# Patient Record
Sex: Female | Born: 1945 | Race: White | Hispanic: No | Marital: Single
Health system: Midwestern US, Community
[De-identification: ages and names within clinical notes are randomized; demographics above are authoritative.]

## PROBLEM LIST (undated history)

## (undated) DIAGNOSIS — J849 Interstitial pulmonary disease, unspecified: Principal | ICD-10-CM

## (undated) DIAGNOSIS — L03116 Cellulitis of left lower limb: Principal | ICD-10-CM

## (undated) DIAGNOSIS — M349 Systemic sclerosis, unspecified: Principal | ICD-10-CM

---

## 2014-09-11 IMAGING — CR L-SPINE 2-3 VWS
1 series · 3 of 3 positions shown · non-contrast
Comparison: None

HISTORY: Pain
TECHNIQUE: L-SPINE 2-3 VWS

[Series 1: AP · 0.17mm/px · 3 of 3 slices shown]
[im 1/3]
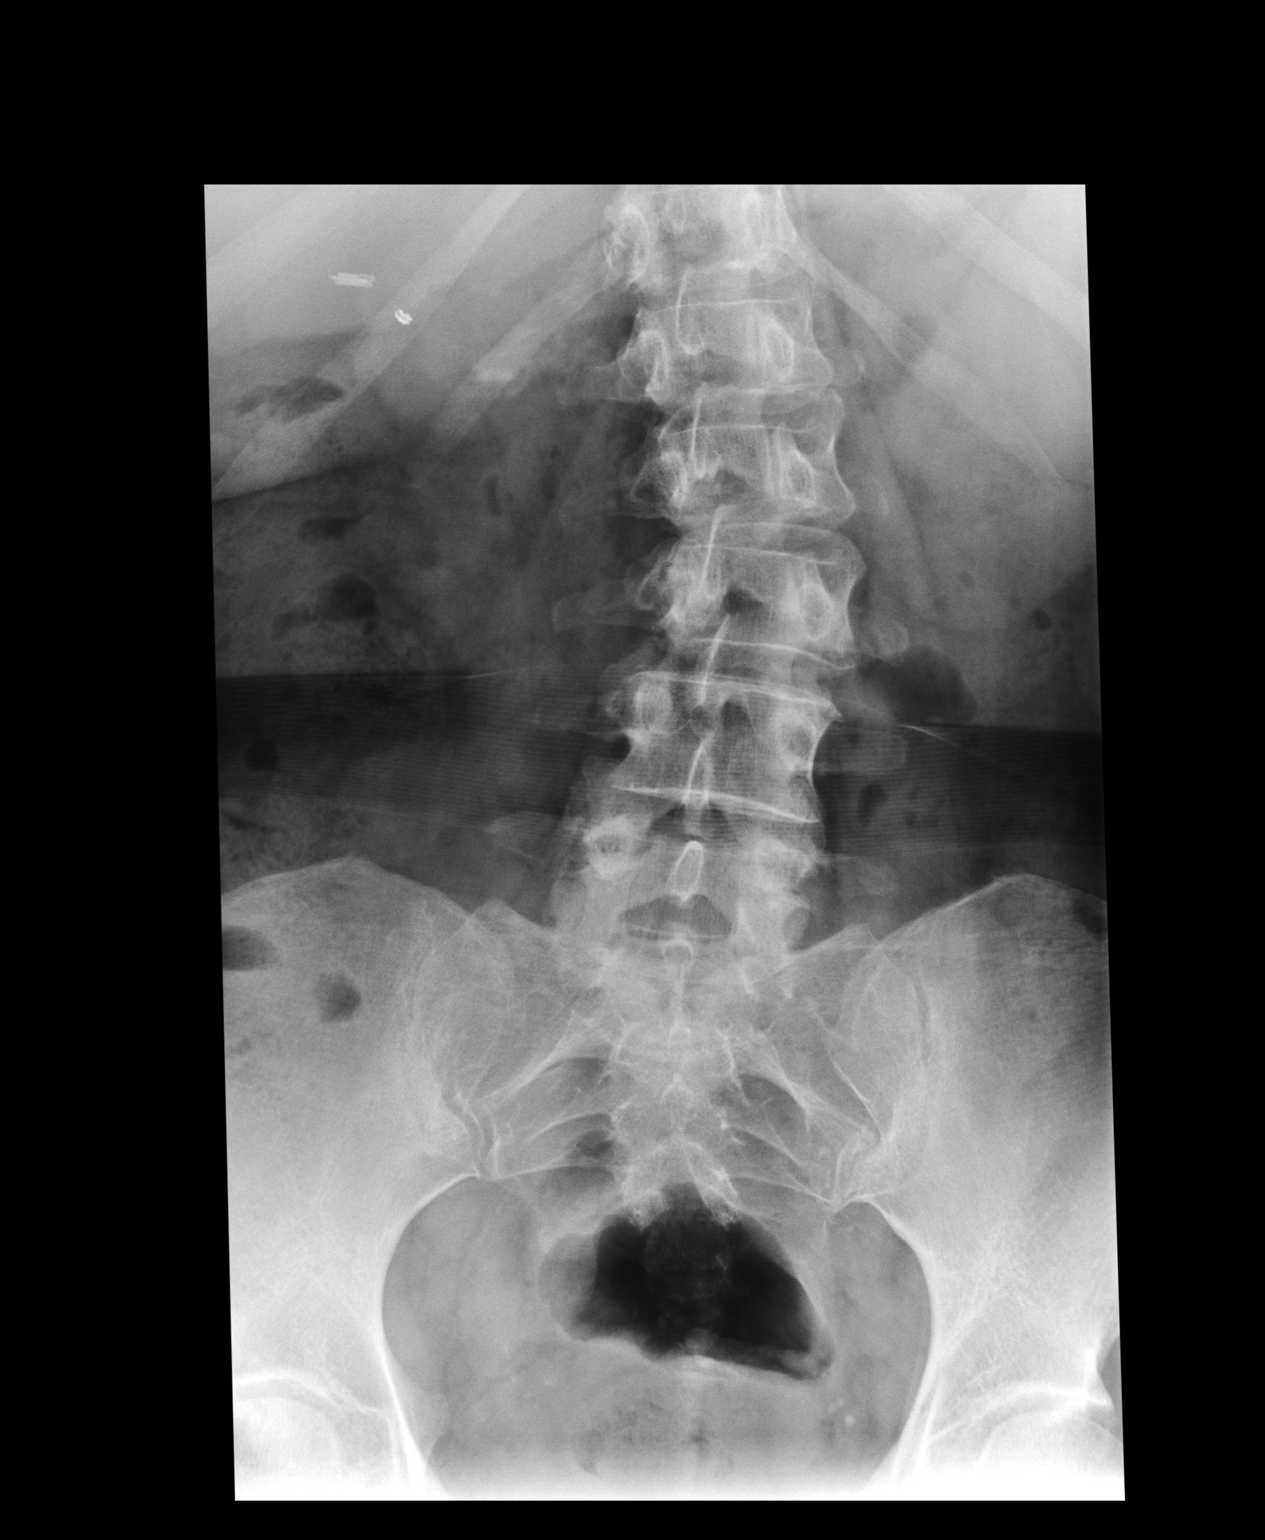
[im 2/3]
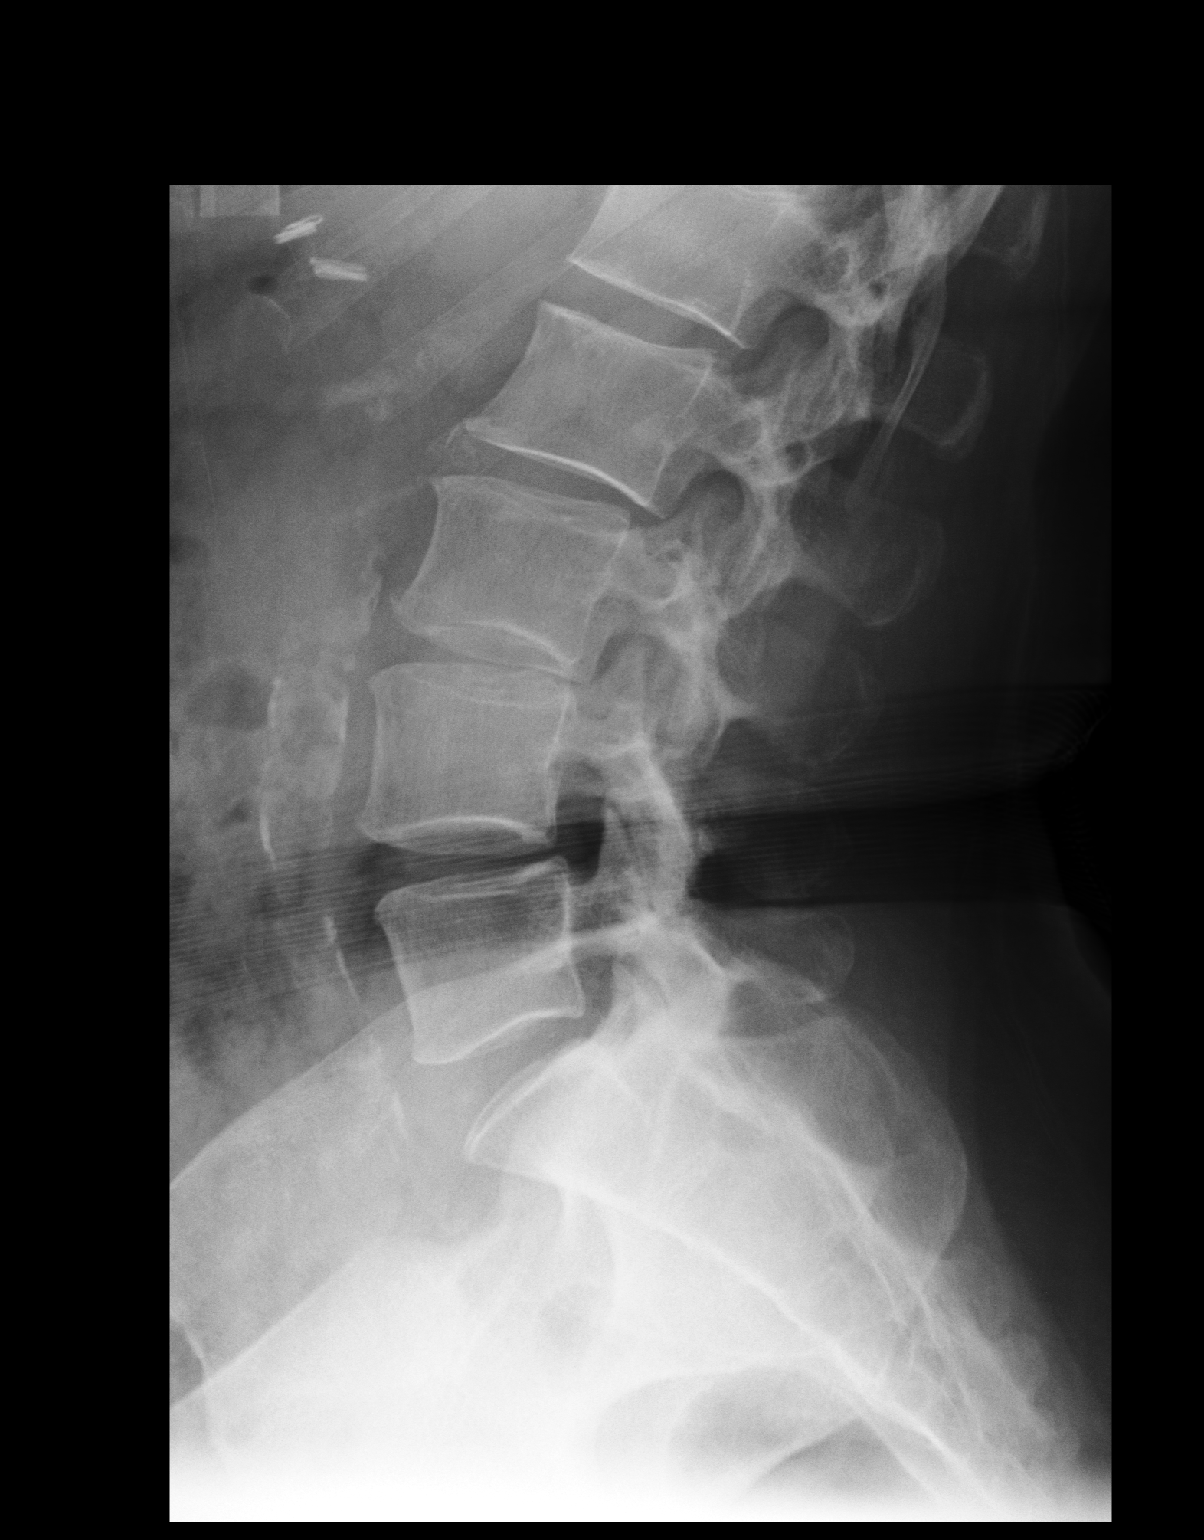
[im 3/3]
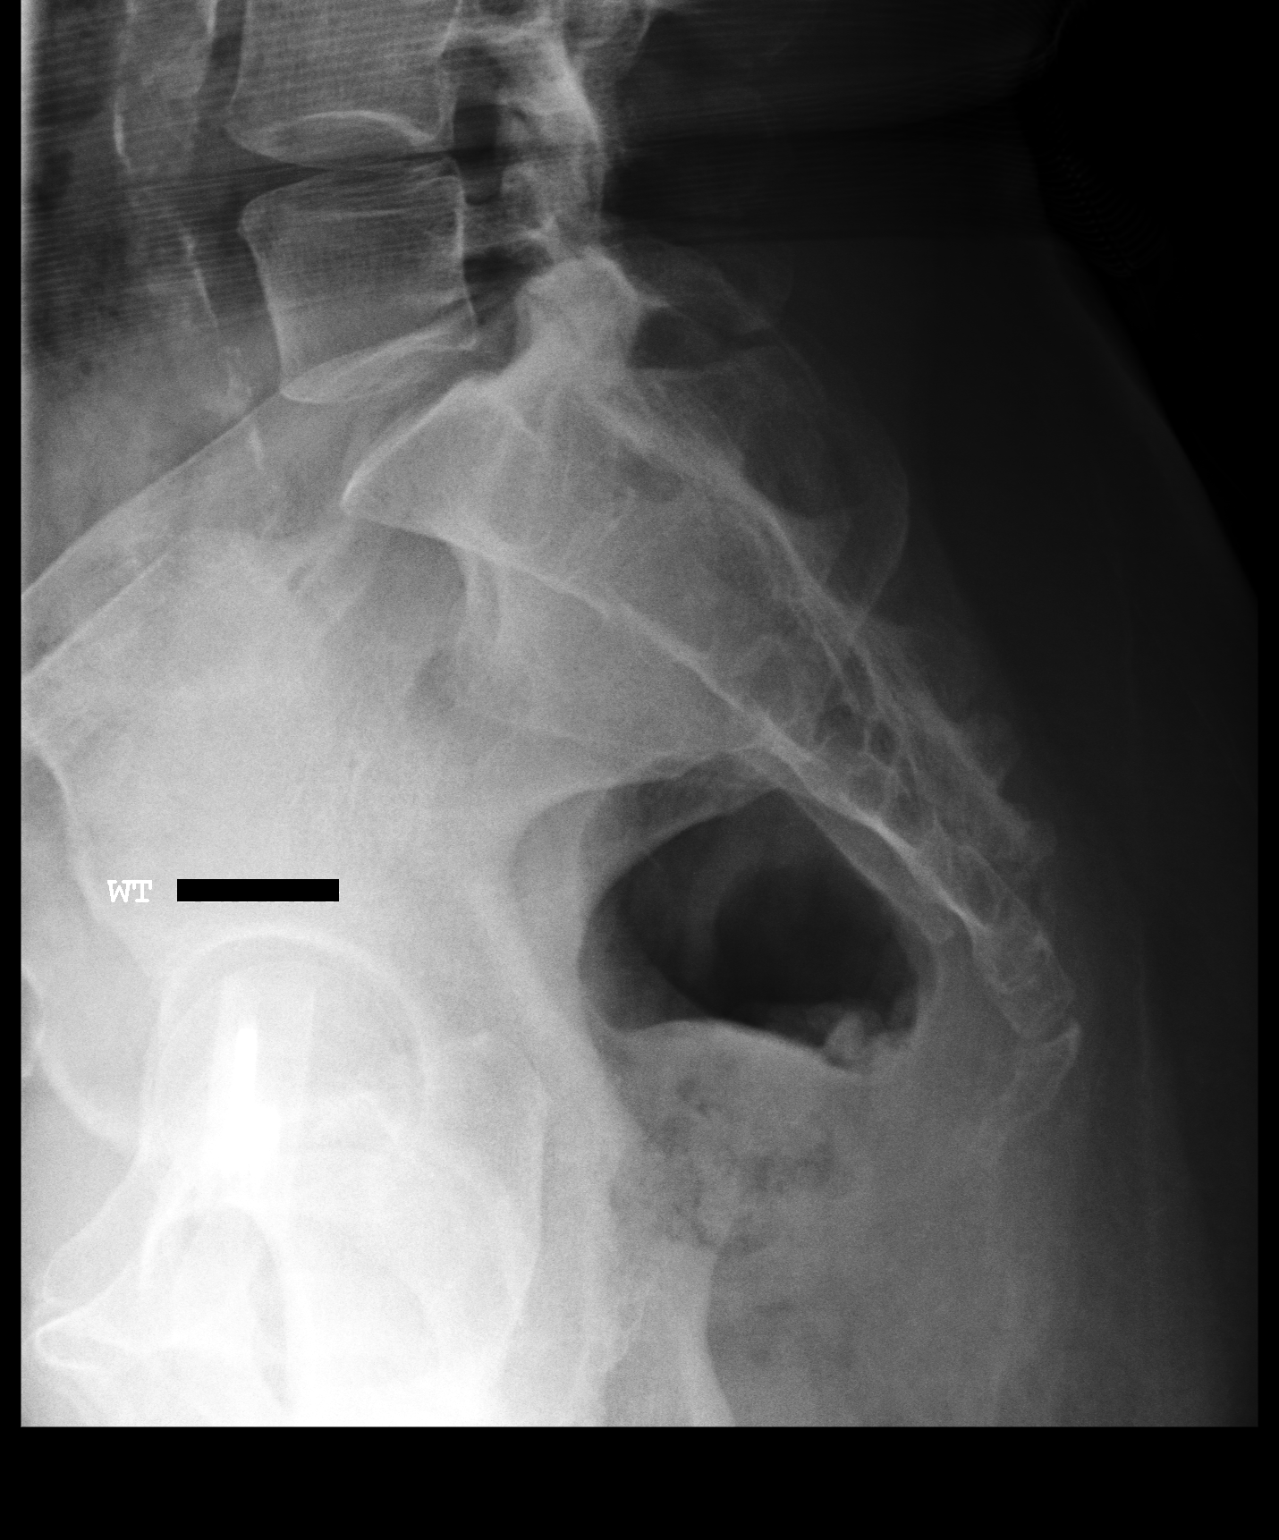

[3 of 3 positions shown; findings below may reference images not displayed]

FINDINGS: Three view lumbar spine demonstrates mild to moderate lumbar levoscoliosis apex at L3. Five lumbar segments. No fractures. No destructive lesions. No anomalous segments. Mild multilevel degenerative loss of disc space height. Mild grade 1 anterolisthesis L4-5. Multilevel facet arthropathy. Vascular calcifications of the aorta. Surgical clips right upper quadrant.
IMPRESSION: Mild to moderate lumbar levoscoliosis with associated multilevel degenerative disc disease and facet arthropathy. Grade 1 anterolisthesis L4-5. No fracture or destructive lesion. No anomalous segments

## 2015-10-26 IMAGING — CT CT ABDOMEN W CONTRAST
2 of 4 series · 16 of 46 positions shown, 18 images · IV contrast (isovue)
Comparison: None available.

HISTORY: Leukemia, left upper quadrant pain.
TECHNIQUE: Axial images through the abdomen and 5 mm. Coronal and sagittal images were reformatted. 

IV contrast: 80 cc Isovue 320. 
Oral contrast: 2777 cc Gastroview. 
I-STAT creatinine 0.8 mg/dL.

[Series 3: soft tissue · axial · 0.72mm/px · z∈[-298,-73]mm · 13 of 51 slices shown, 15 images]
[im 3/51  soft-tissue]
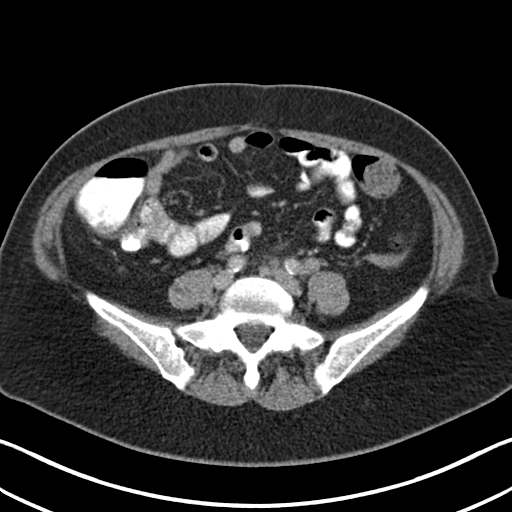
[im 3/51  bone]
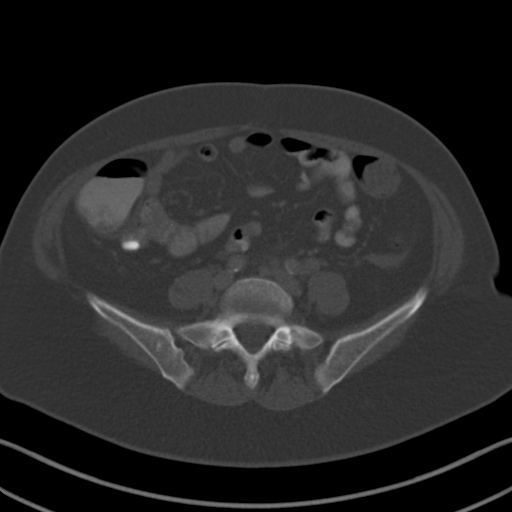
[im 7/51  soft-tissue]
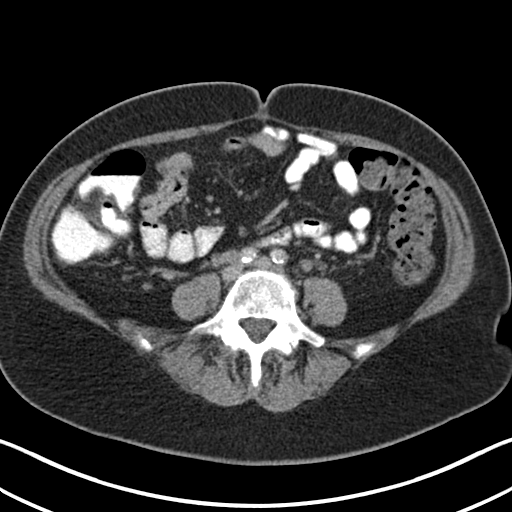
[im 12/51  soft-tissue]
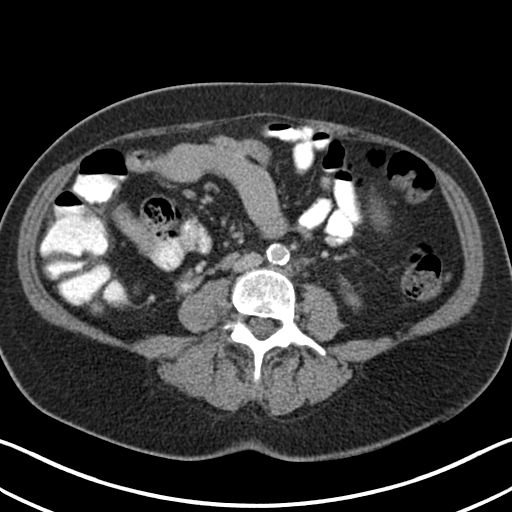
[im 14/51  soft-tissue]
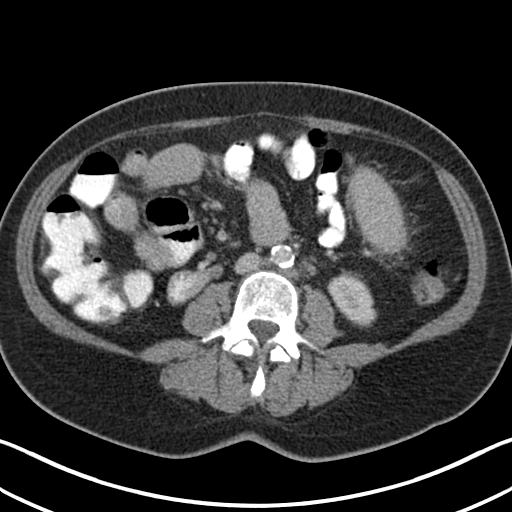
[im 19/51  soft-tissue]
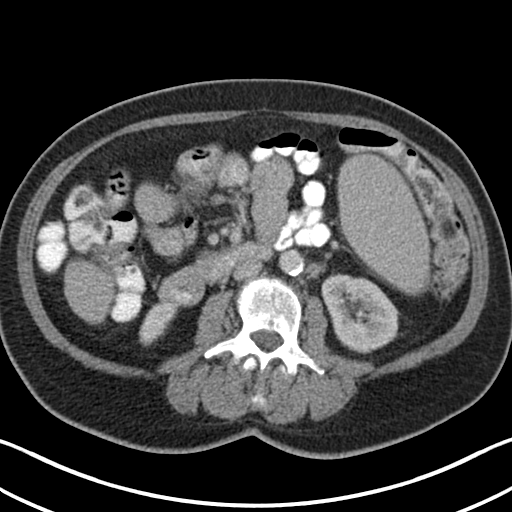
[im 21/51  soft-tissue]
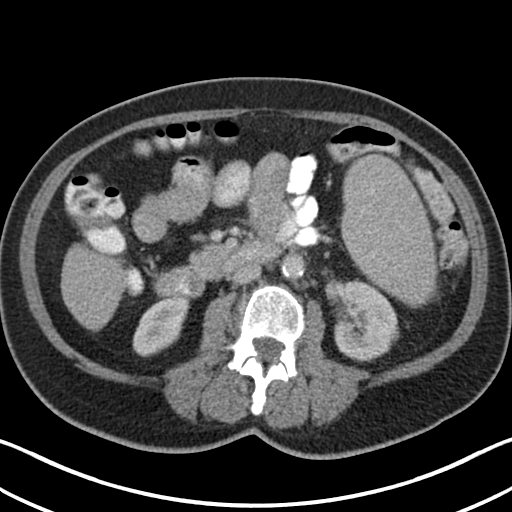
[im 26/51  soft-tissue]
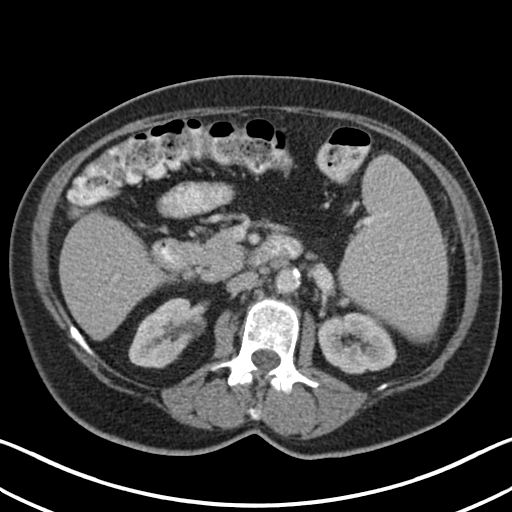
[im 30/51  soft-tissue]
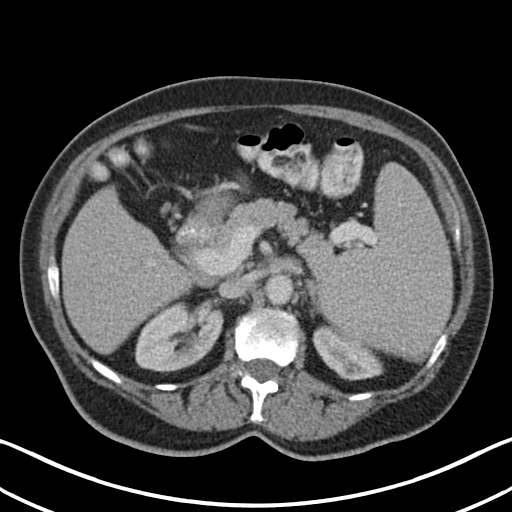
[im 32/51  soft-tissue]
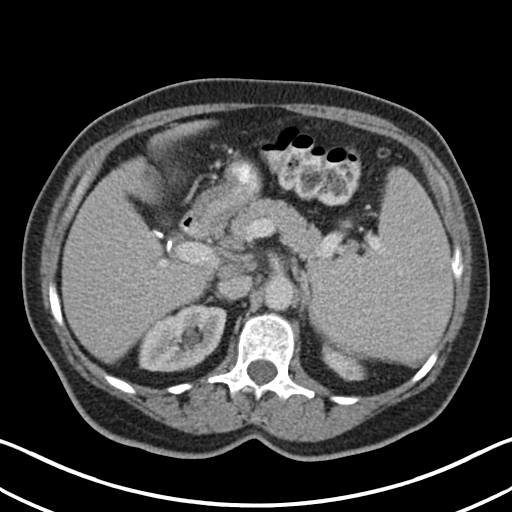
[im 32/51  bone]
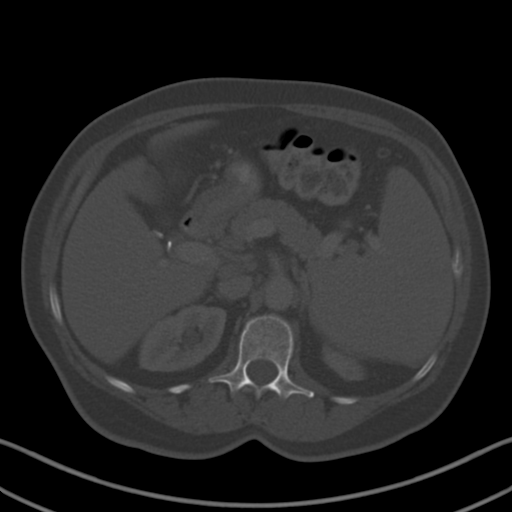
[im 37/51  soft-tissue]
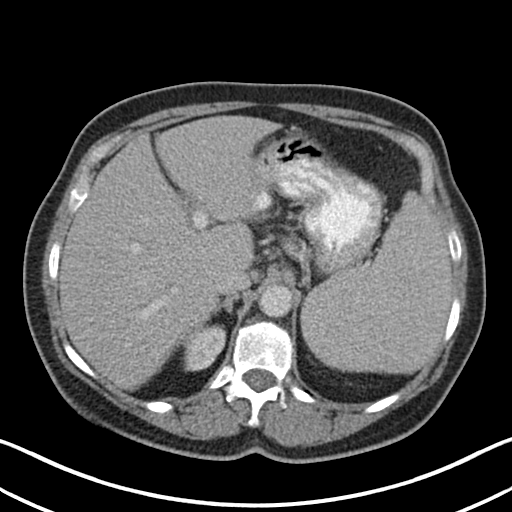
[im 39/51  soft-tissue]
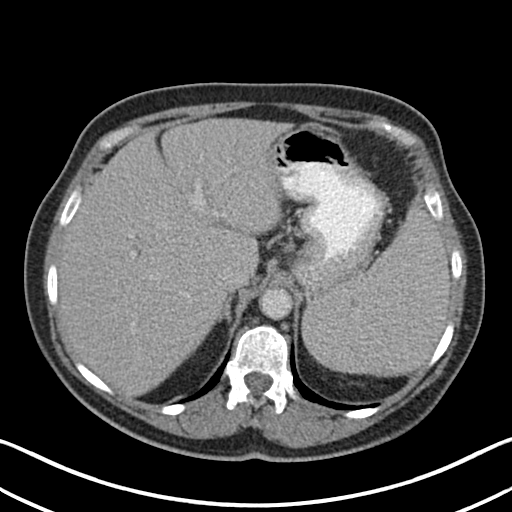
[im 44/51  soft-tissue]
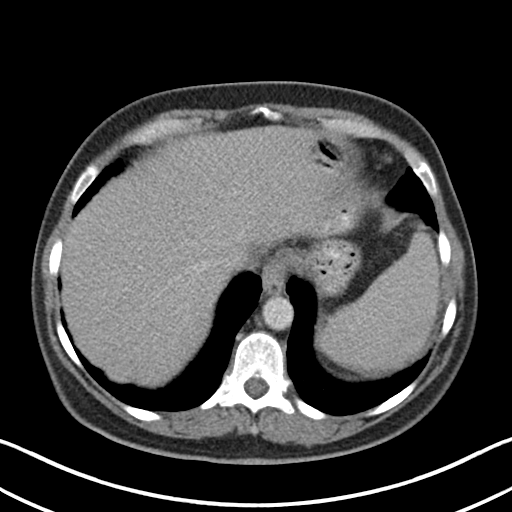
[im 48/51  soft-tissue]
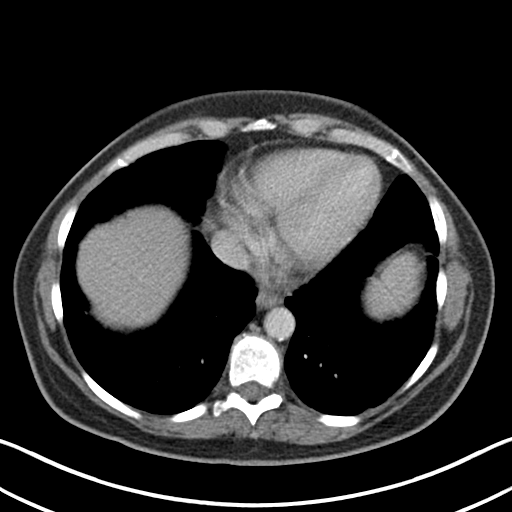

[Series 7: coronal · coronal · 0.53mm/px · 3 of 53 slices shown]
[im 18/53  soft-tissue]
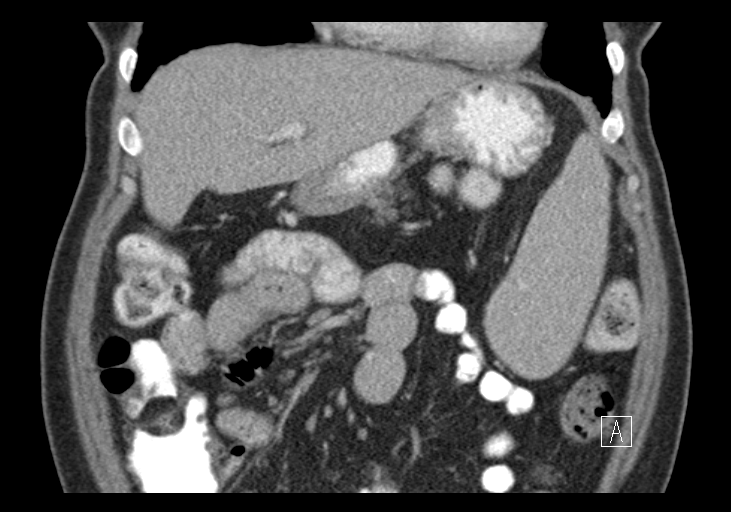
[im 24/53  soft-tissue]
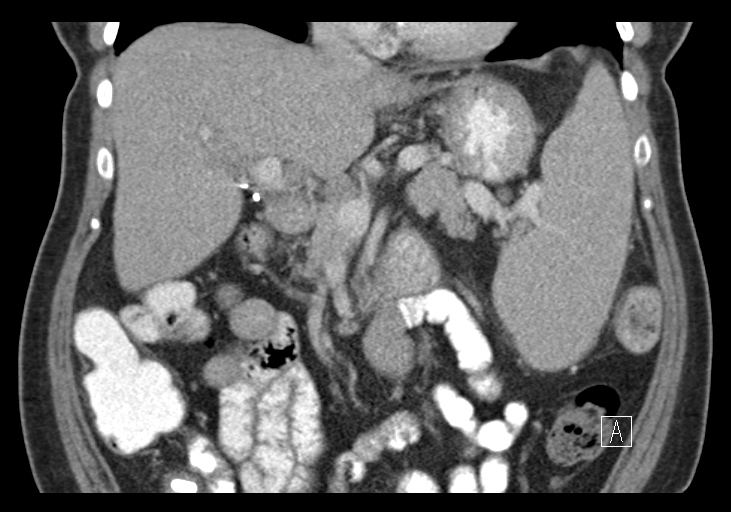
[im 29/53  soft-tissue]
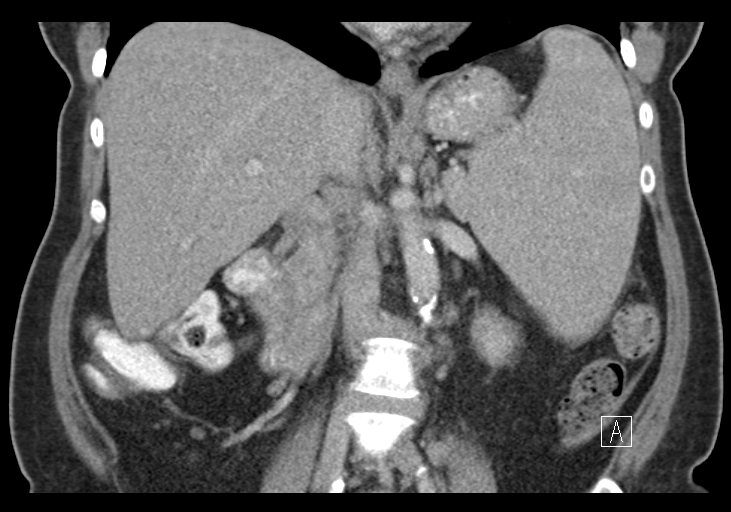

[16 of 46 positions shown; findings below may reference images not displayed]

FINDINGS: The lung bases are clear.

The spleen is enlarged measuring 14 x 7.8 cm axial dimension and 17 cm craniocaudad dimension. There are mildly enlarged lymph nodes near the porta hepatis/celiac artery. Largest measures 1.6 cm greatest dimension.

The gallbladder has been removed. The liver is unremarkable in appearance. The pancreas, adrenals and kidneys are unremarkable apart from a small cyst at the upper pole of the left kidney.

The abdominal aorta is normal in caliber. There are mildly enlarged retroperitoneal lymph nodes. The largest measures 2 cm, adjacent to the left common iliac artery.

There is no free intraperitoneal air or free fluid. No abnormally dilated or thickened loops of bowel are identified.

Bone windows show no destructive lesion.
IMPRESSION: Splenomegaly with mildly enlarged upper abdominal and retroperitoneal lymph nodes. If there are prior outside studies available for comparison, stability of these findings can be assessed. Alternatively, metabolic assessment with PET/CT may be of benefit.

## 2016-03-13 IMAGING — CT CT CHEST ABD PELVIS W CON
2 of 4 series · 14 of 46 positions shown, 16 images · non-contrast
Comparison: CT abdomen study October 26, 2015.

HISTORY: Chronic lymphocytic leukemia, B-cell type.
TECHNIQUE: Axial images of the chest, abdomen and pelvis were obtained from the lung apices through the pubic symphysis following 100 cc Optiray 320 intravenous contrast. Serum creatinine 0.7 mg/dL. Coronal and sagittal reformation images were obtained.

[Series 3: soft tissue · axial · 0.70mm/px · z∈[-655,-85]mm · 11 of 126 slices shown, 13 images]
[im 6/126  soft-tissue]
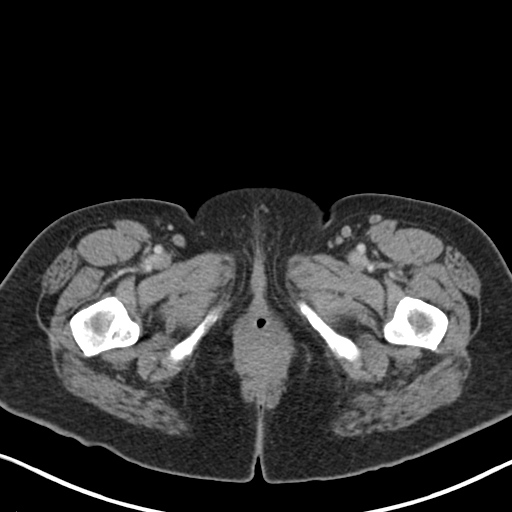
[im 6/126  bone]
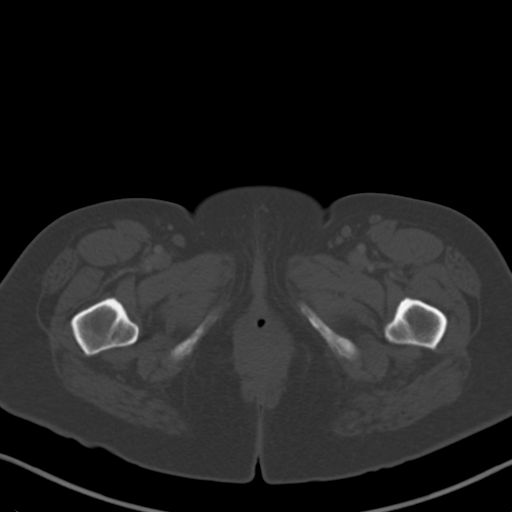
[im 18/126  soft-tissue]
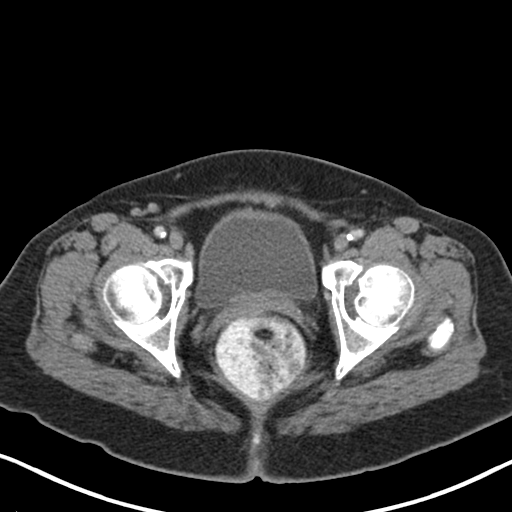
[im 29/126  soft-tissue]
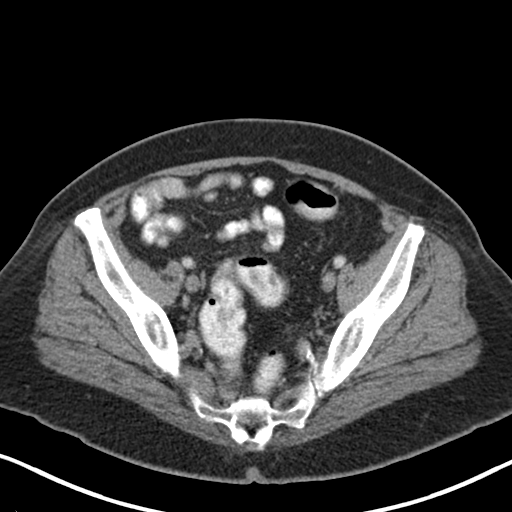
[im 40/126  soft-tissue]
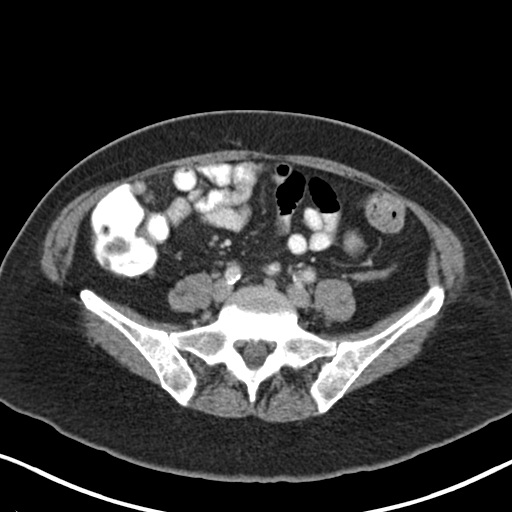
[im 52/126  soft-tissue]
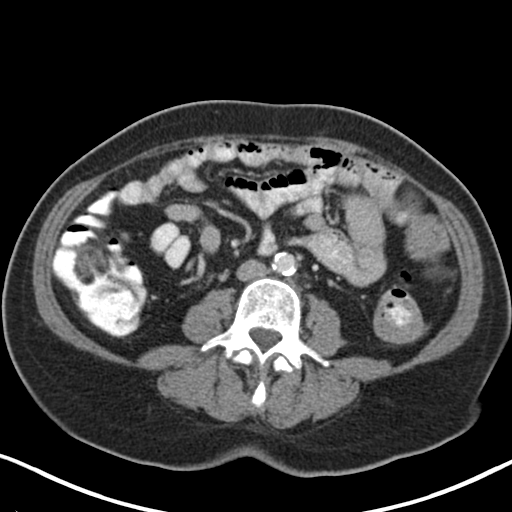
[im 63/126  soft-tissue]
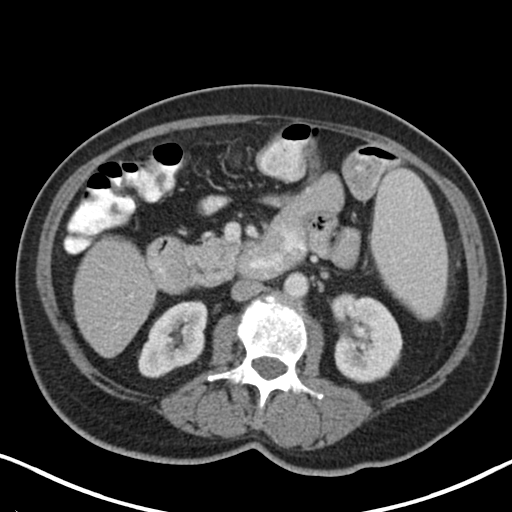
[im 74/126  soft-tissue]
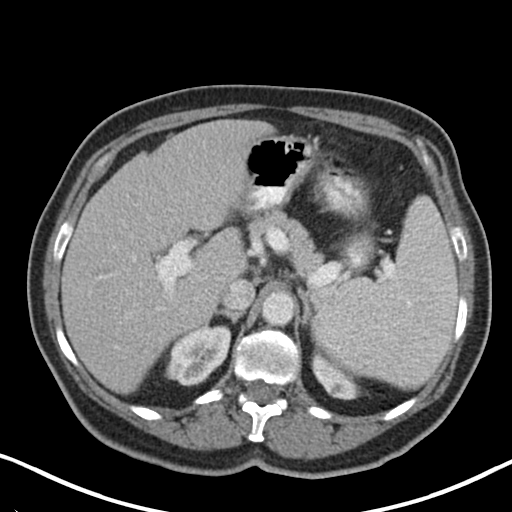
[im 86/126  soft-tissue]
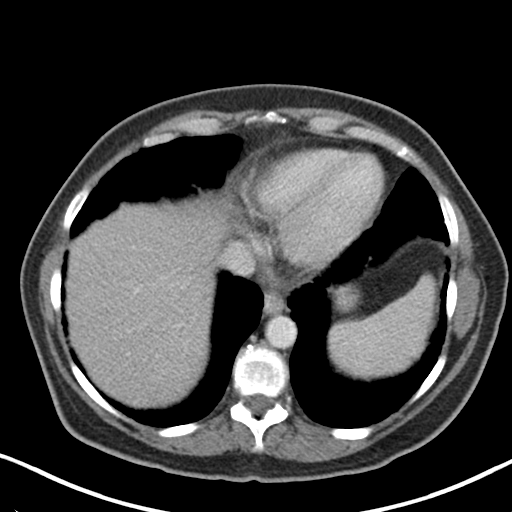
[im 97/126  soft-tissue]
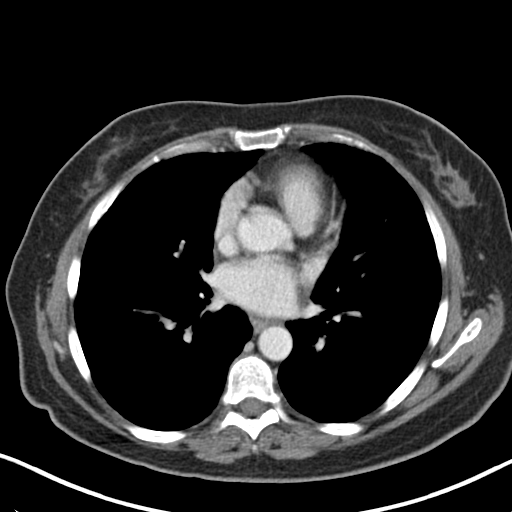
[im 97/126  bone]
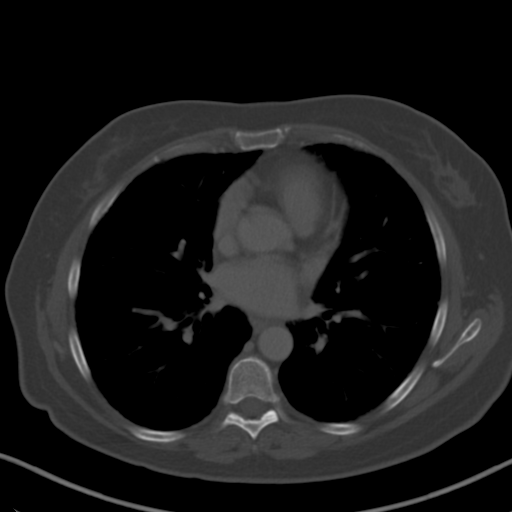
[im 108/126  soft-tissue]
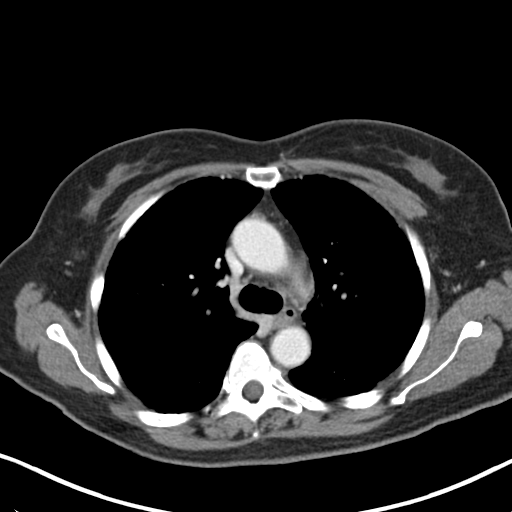
[im 120/126  soft-tissue]
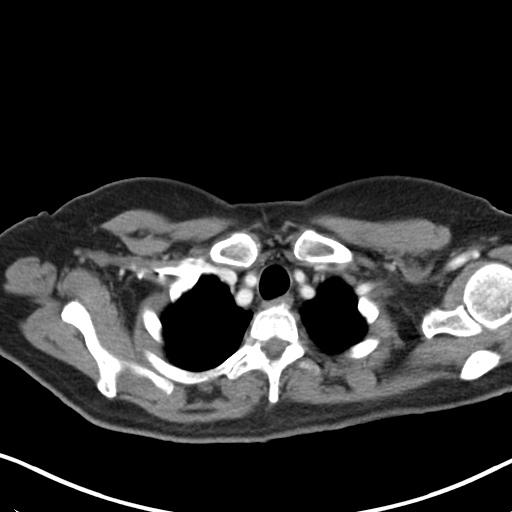

[Series 5: coronal · coronal · 0.74mm/px · 3 of 52 slices shown]
[im 18/52  soft-tissue]
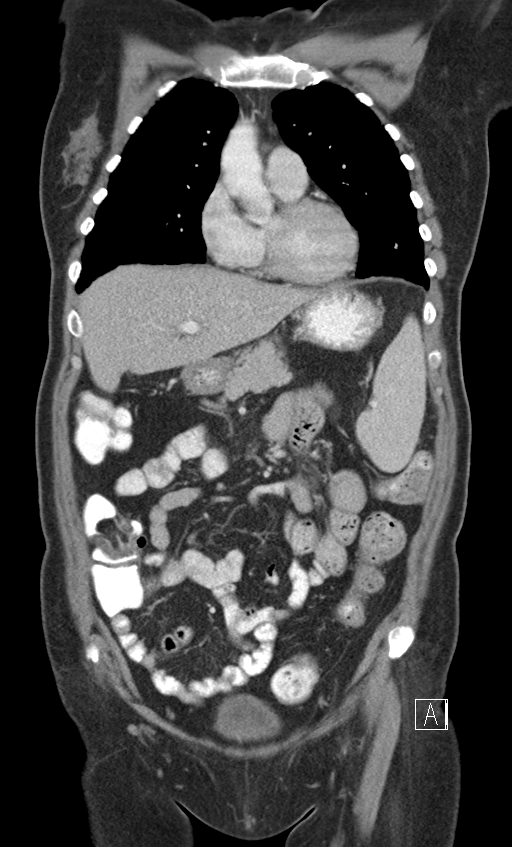
[im 23/52  soft-tissue]
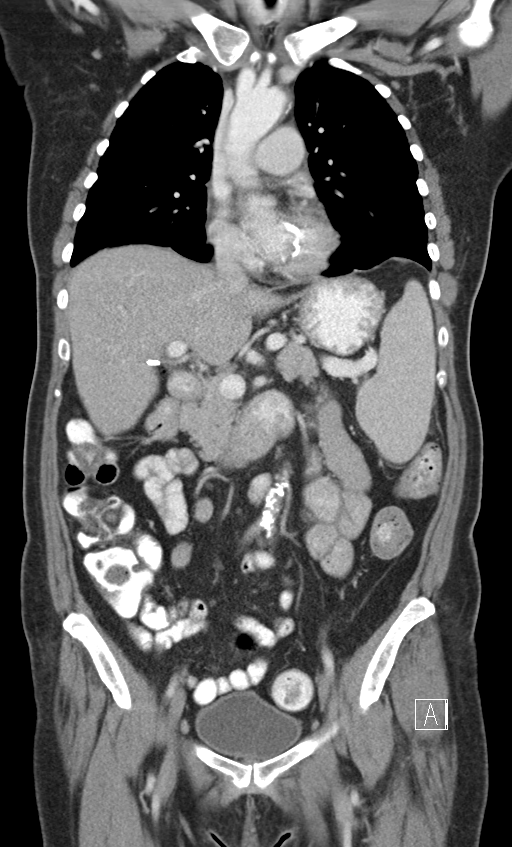
[im 29/52  soft-tissue]
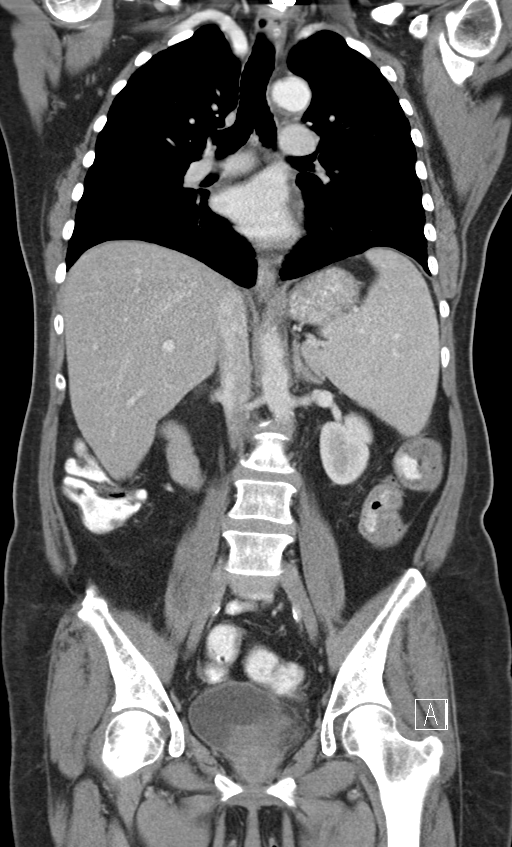

[14 of 46 positions shown; findings below may reference images not displayed]

FINDINGS: CT CHEST:

Peripheral intralobular and interlobular interstitial thickening identified bilaterally. No area of honeycombing seen. No pulmonary nodules seen. Trachea and main bronchi are normal.

Heart size is normal. There is no pericardial effusion. Calcification involving mitral annulus identified. Calcified and noncalcified plaques of aorta seen without aneurysm. Main pulmonary arteries are normal. Thyroid gland and esophagus are normal. No enlarged mediastinal or axillary lymph nodes seen. Breast tissues are normal. Bones show no acute pathology. Curvature of middle thoracic spine to right seen.

CT ABDOMEN:

Calcified plaques of aorta seen without aneurysm. Inferior vena cava is normal.

There is prior cholecystectomy. Liver, pancreas, adrenal glands and right kidney are normal. Small simple cyst superior pole of left kidney seen. Splenomegaly identified approximately 13.6 cm in maximum diameter in the axial plane. No splenic mass or cyst seen.

Distal esophagus, stomach and bowel loops are normal. Appendix is surgically absent. 5 mm umbilical fatty hernia seen. No enlarged lymph nodes seen. Degenerative changes of spine seen. Curvature lumbar spine to left with apex at L3 seen.

CT PELVIS:

Vaginal cuff is normal. Urinary bladder is normal. Ovaries are surgically absent. Distal bowel loops are normal. No enlarged lymph nodes seen. Degenerative changes of SI joints and pubic symphysis identified.

Coronal and sagittal reformation images confirm above findings.
IMPRESSION: Splenomegaly identified with interval slight improvement.

Interval resolution of lymphadenopathy of upper abdomen seen on previous study.

No new foci of lymphadenopathy seen.

There is overall improvement of leukemia.

Chronic interstitial disease identified bilaterally.

Additional chronic findings of chest, abdomen and pelvis as noted.

## 2016-05-02 IMAGING — MG MAMMO SCRN BIL W/CAD TOMO
8 series · 8 of 24 positions shown · non-contrast
Comparison: none

Images Obtained from [facility]
CLINICAL RA REF: Mammo Scrn bilateral (Digital) W/Cad Routine with Tomosynthesis.
Digital images were generated from the 3D Tomosynthesis data acquired during the exam.
No prior exams were available for comparison.
There are scattered fibroglandular elements in both breasts.
Current study was also evaluated with a Computer Aided Detection (CAD) system.
No significant masses, calcifications, or other findings are seen in either breast.

[L CC]
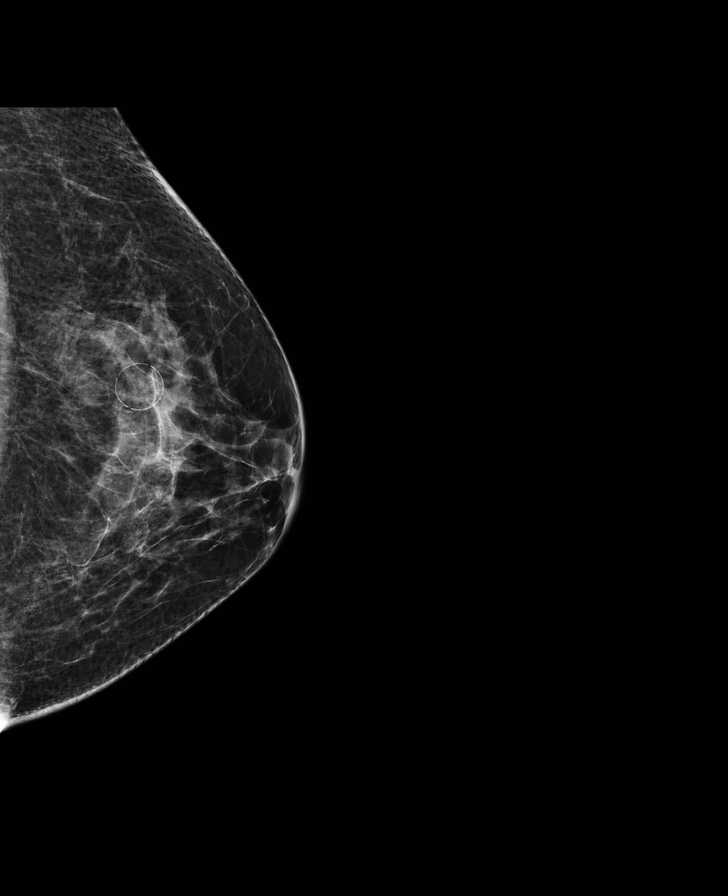

[R CC]
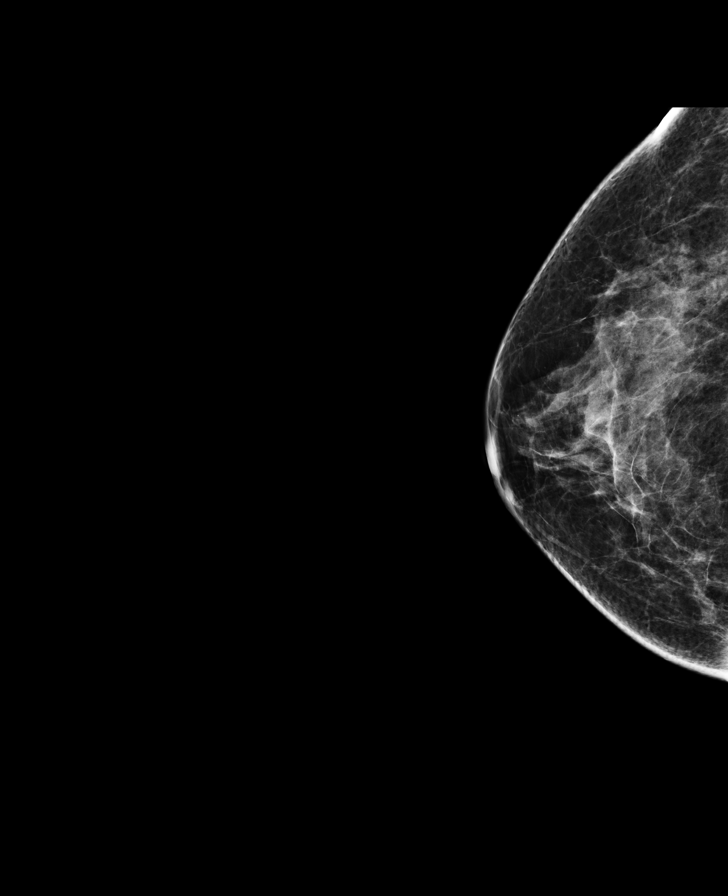

[L MLO]
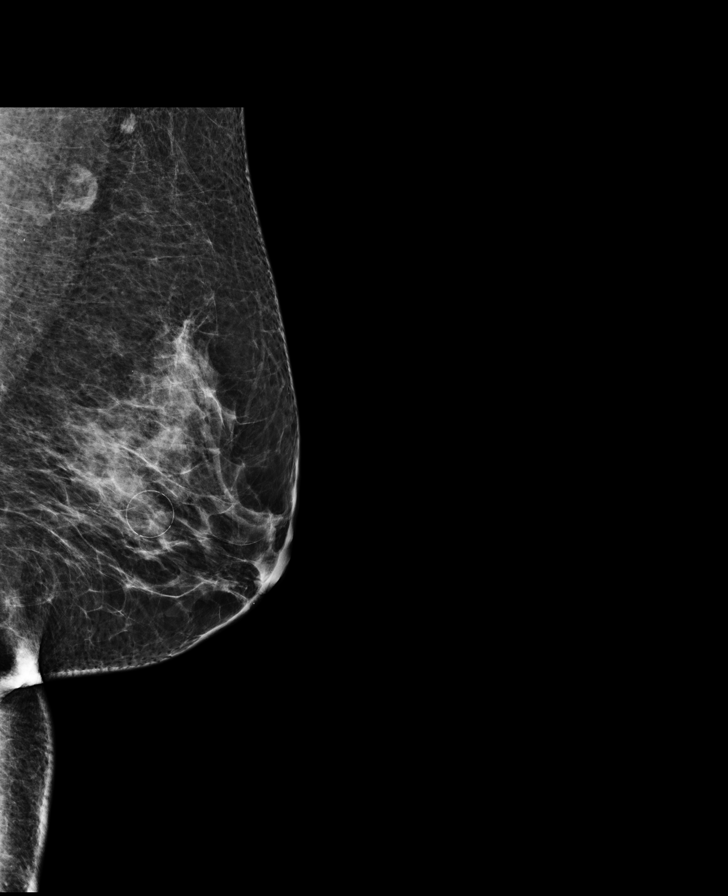

[R MLO]
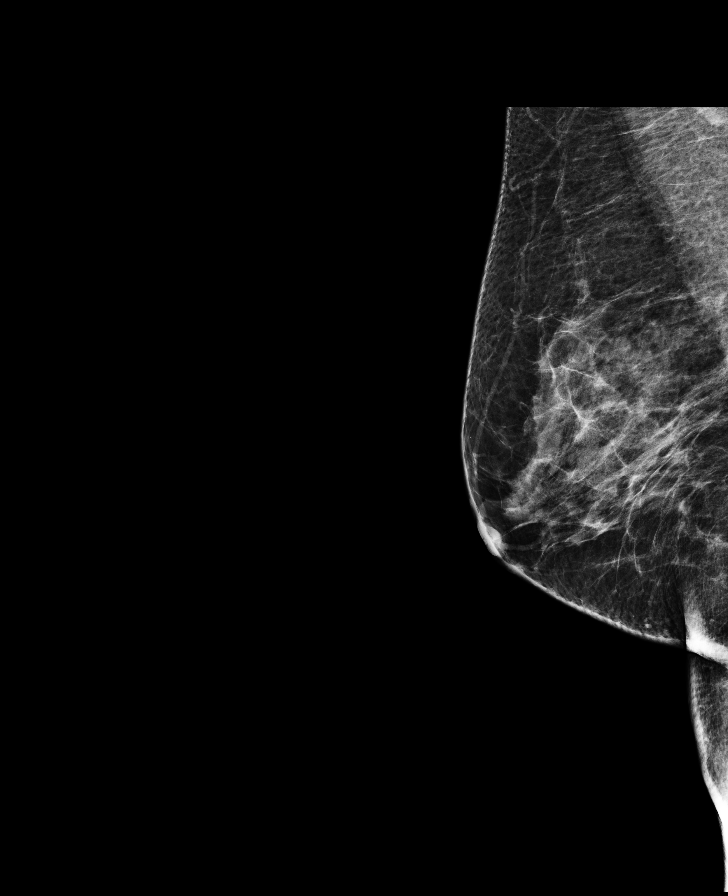

[L CC tomo · tomo slice 39/78.0]
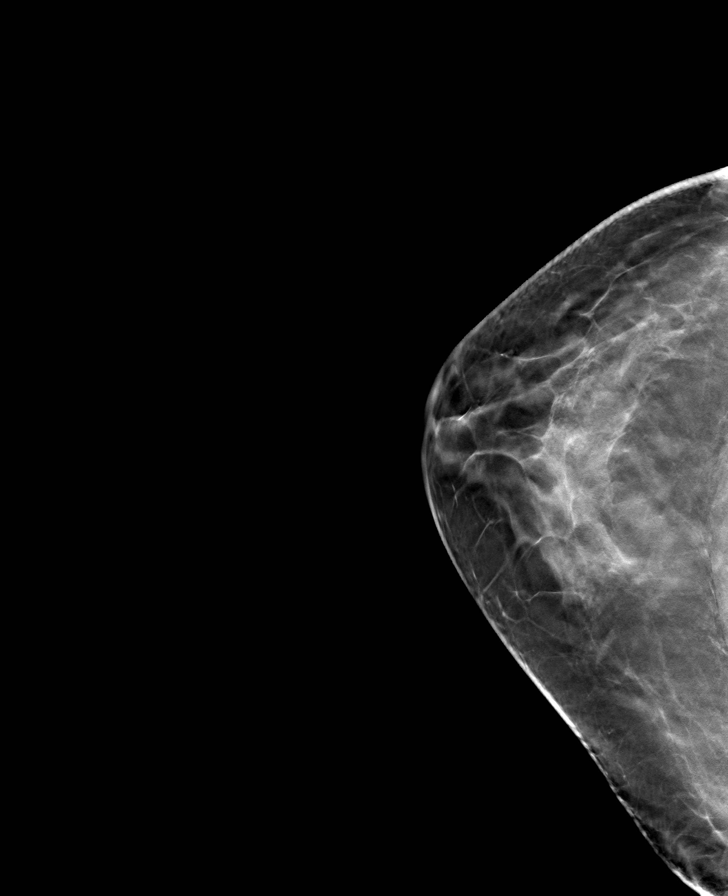

[R MLO tomo · tomo slice 37/72.0]
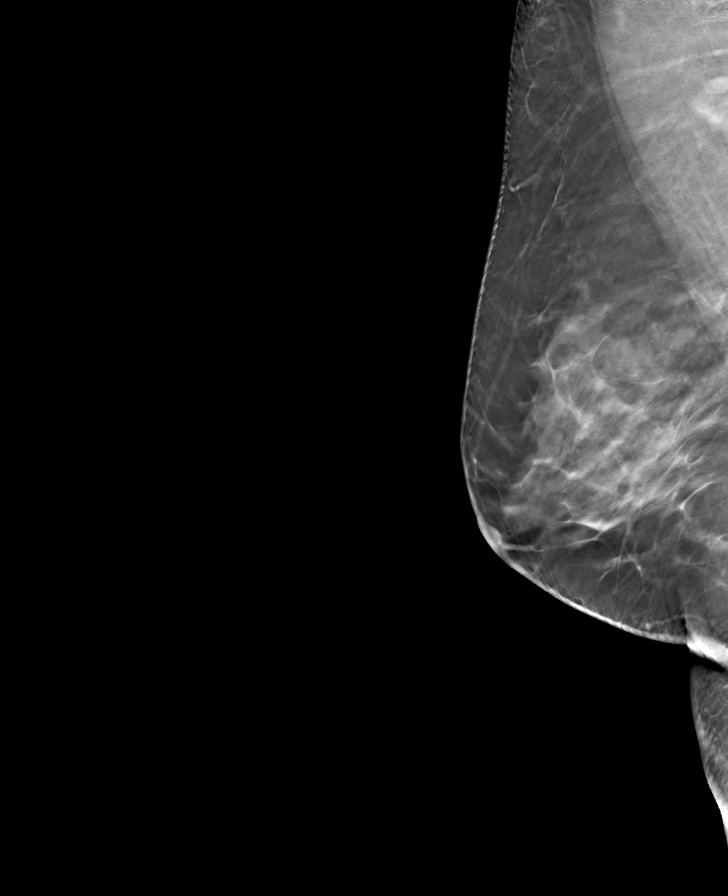

[L MLO tomo · tomo slice 39/77.0]
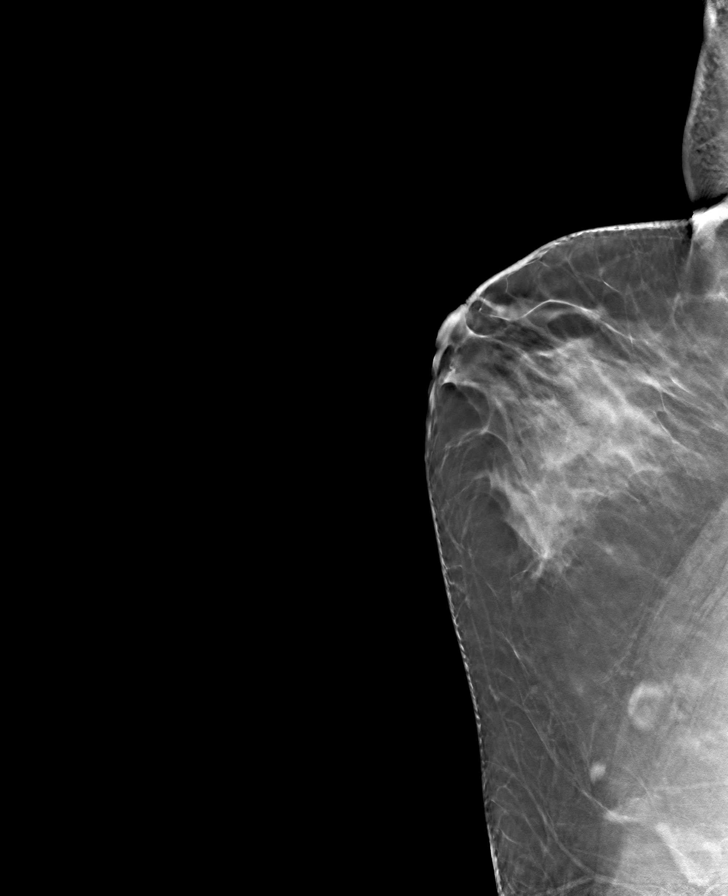

[R CC tomo · tomo slice 35/69.0]
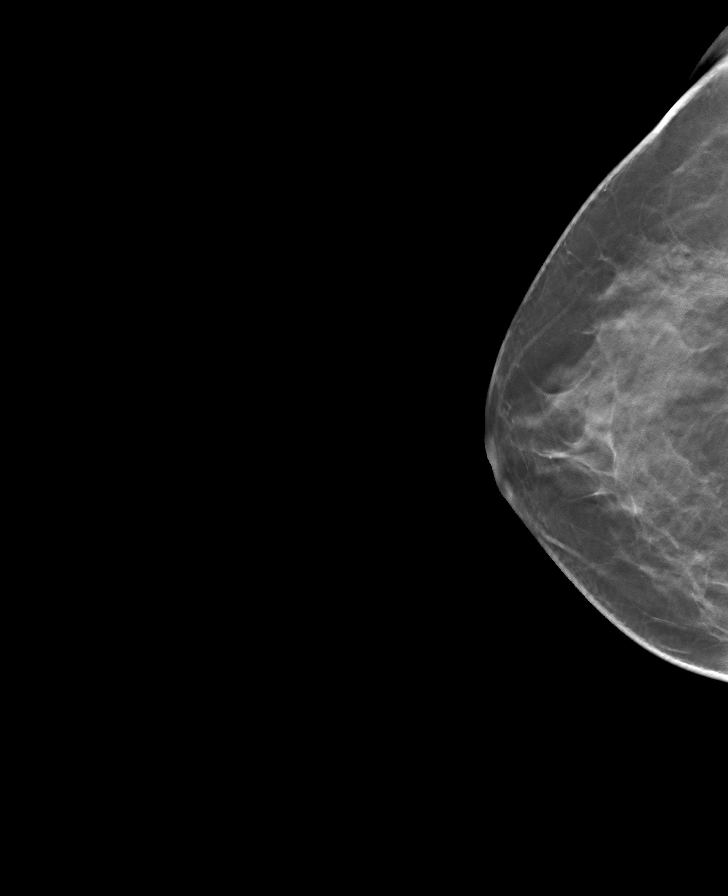

[8 of 24 positions shown; findings below may reference images not displayed]

IMPRESSION: There is no mammographic evidence of malignancy. A 1 year screening mammogram is recommended.
mc/penrad:05/03/2016 [DATE]
letter sent: Normal
Mammogram BI-RADS: 1 Negative

## 2019-09-22 IMAGING — MR MRI KNEE RT WO CONTRAST
4 of 5 series · 28 of 40 positions shown · non-contrast
Comparison: None.

INDICATION: Effusion and pain in right knee.
TECHNIQUE: Multiplanar, multiecho imaging of the right knee was performed, including T1-weighted and fluid sensitive sequences without intravenous contrast administration.

[Series 5: t2_axial_fs · axial · right · 3.0mm · 0.42mm/px · z∈[-27,+80]mm · 8 of 28 slices shown]
[im 1/28]
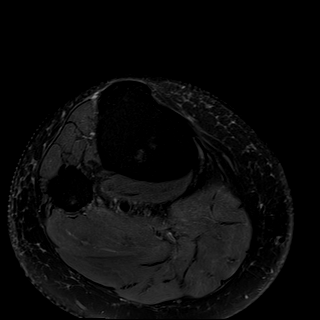
[im 4/28]
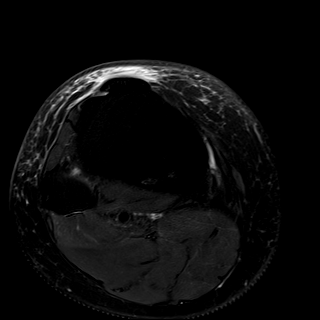
[im 8/28]
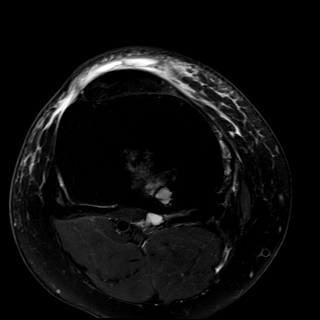
[im 12/28]
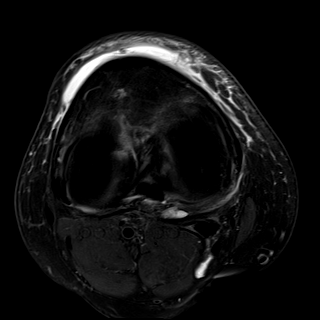
[im 16/28]
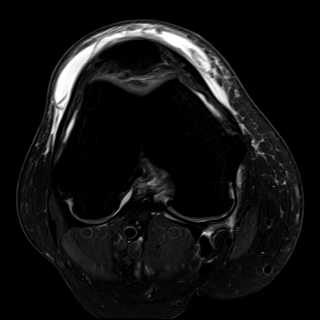
[im 20/28]
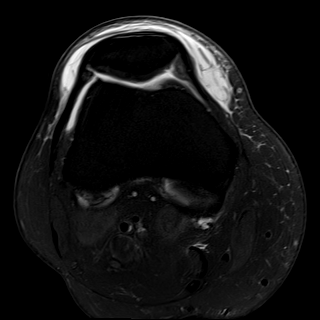
[im 24/28]
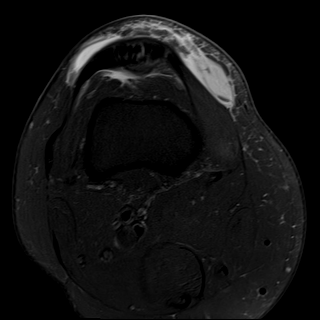
[im 28/28]
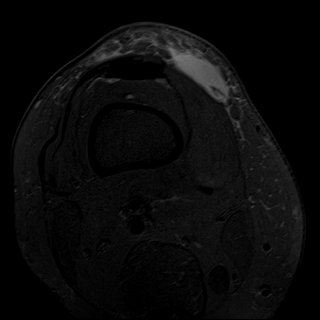

[Series 7: t1_cor · coronal · right · 3.5mm · 0.38mm/px · 7 of 25 slices shown]
[im 1/25]
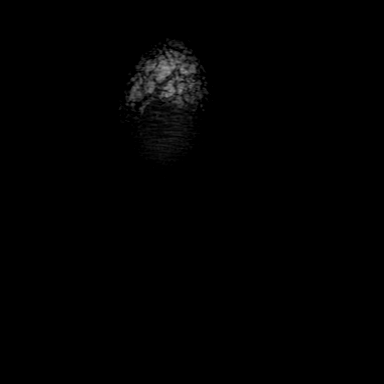
[im 5/25]
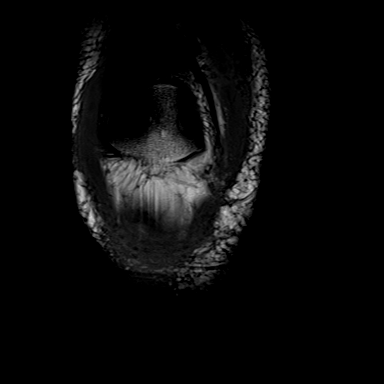
[im 9/25]
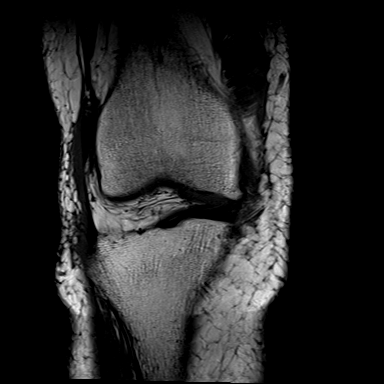
[im 13/25]
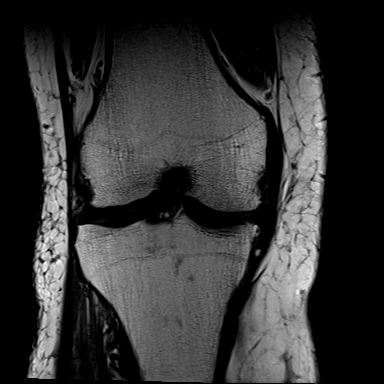
[im 17/25]
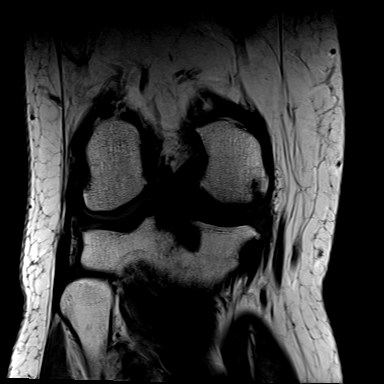
[im 21/25]
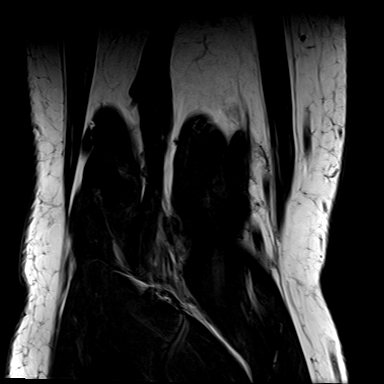
[im 25/25]
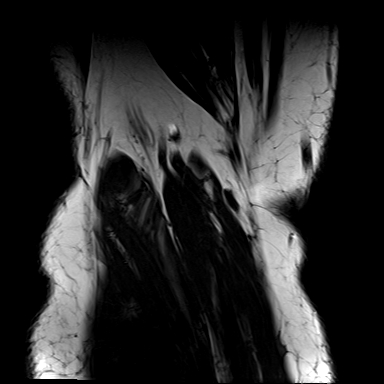

[Series 8: t2_cor_fs · coronal · right · 3.5mm · 0.45mm/px · 7 of 25 slices shown]
[im 1/25]
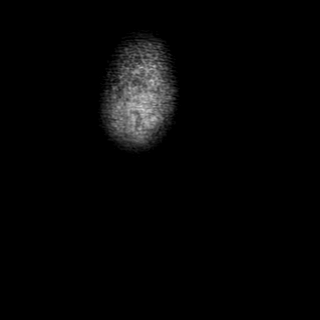
[im 5/25]
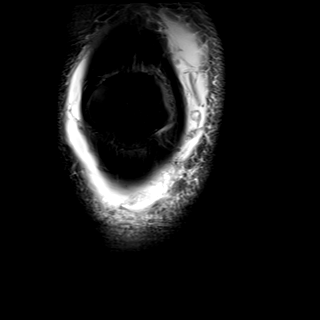
[im 9/25]
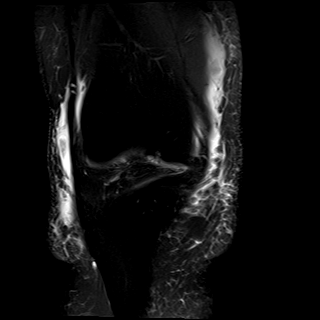
[im 13/25]
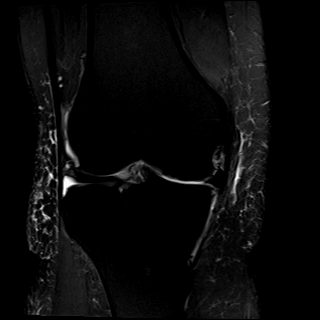
[im 17/25]
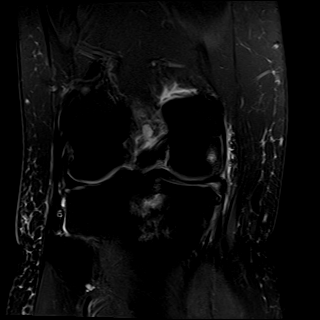
[im 21/25]
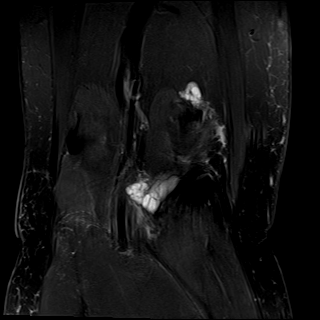
[im 25/25]
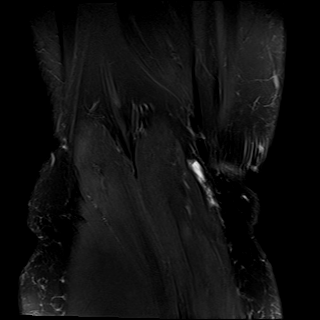

[Series 100: pd_sag_fs · sagittal · right · 3.0mm · 0.23mm/px · 6 of 29 slices shown]
[im 1/29]
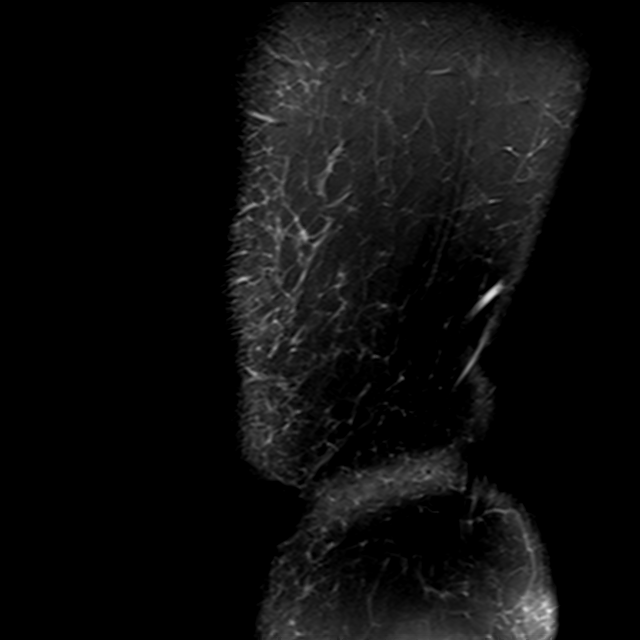
[im 4/29]
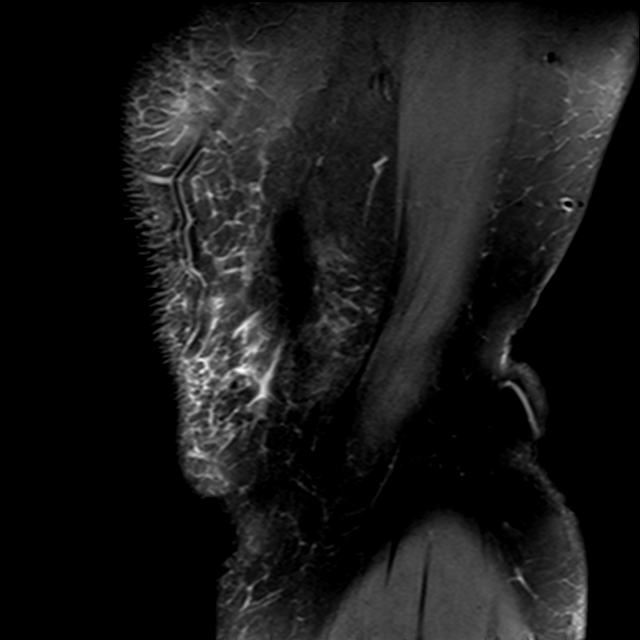
[im 8/29]
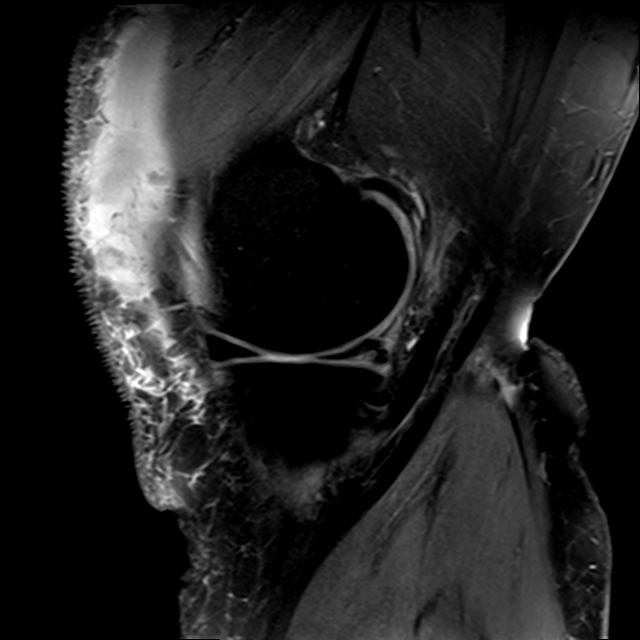
[im 11/29]
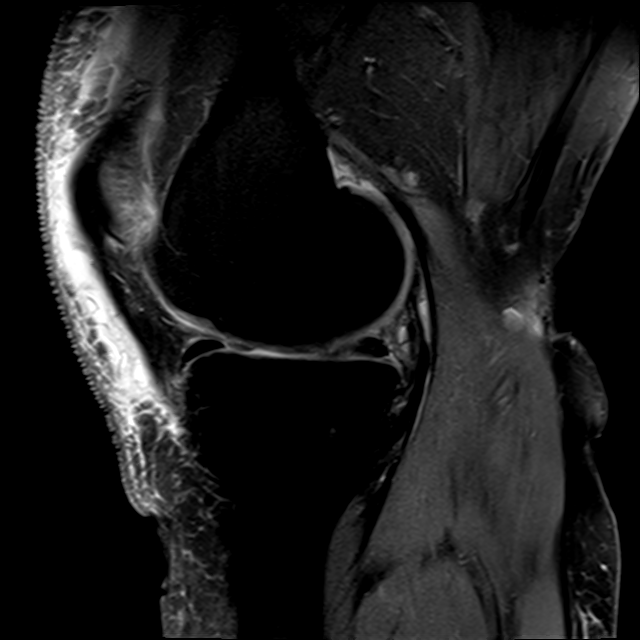
[im 15/29]
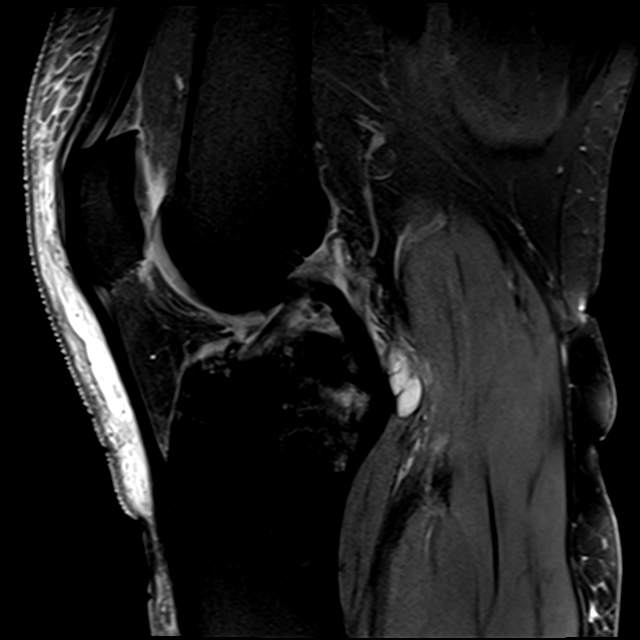
[im 25/29]
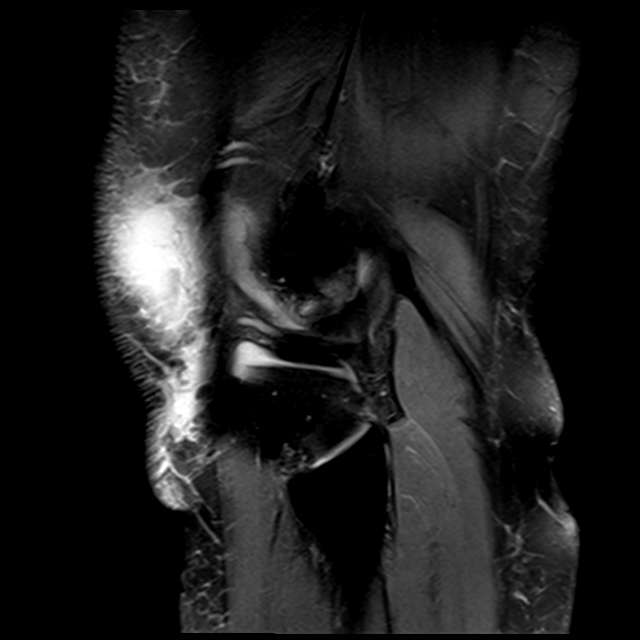

[28 of 40 positions shown; findings below may reference images not displayed]

FINDINGS: MEDIAL MENISCUS:  Large oblique undersurface tear at the junction of the posterior horn and body with displaced undersurface flap into the inferior medial gutter. Small radial tear/fraying of the posterior horn.

LATERAL MENISCUS:  Intact.

ACL:  Intact.

PCL:  Intact.

MCL:  Intact.

LATERAL LIGAMENTS AND TENDONS:  Intact.

EXTENSOR MECHANISM:  The quadriceps and patellar tendons are intact.

FAT PADS:   Normal.

CARTILAGE:  

Patellofemoral compartment:  Small high-grade partial-thickness chondral defect at the patellar apex and medial patellar facet. High-grade partial-thickness chondral defect at the inferior femoral trochlea.

Medial compartment:  Extensive low-grade and high-grade partial-thickness chondral loss, most pronounced at the weightbearing femoral condyle.

Lateral compartment: Intact.

BONE MARROW: No acute fracture.

Large hemorrhagic prepatellar bursitis. Mild scattered subcutaneous edema anterior to the knee. Trace joint effusion. Tiny Baker's cyst. Small multiloculated ganglion cyst posterior to the tibial attachment of the PCL.
IMPRESSION: 1. Large oblique undersurface tear at the junction of the posterior horn and body medial meniscus, with displaced undersurface flap into the inferior medial gutter. Small radial tear/fraying at the posterior horn medial meniscus.

2. Moderate patellofemoral and medial compartment chondral abnormalities.

3. Large hemorrhagic prepatellar bursitis.

## 2020-12-08 IMAGING — MR MRI LSPINE WO CONTRAST
4 of 5 series · 27 of 48 positions shown · non-contrast
Comparison: Radiograph lumbar spine 09/11/14, CT chest, abdomen and pelvis 03/13/16

HISTORY: 74-year-old female with chronic low back pain.
TECHNIQUE: Multiplanar, multisequential MR images of the lumbar spine were obtained without intravenous contrast.

[Series 16: t2_sag · sagittal · 4.0mm · 0.74mm/px · 7 of 21 slices shown]
[im 1/21]
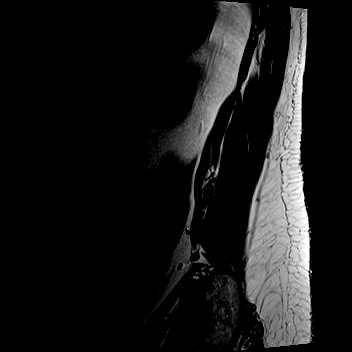
[im 4/21]
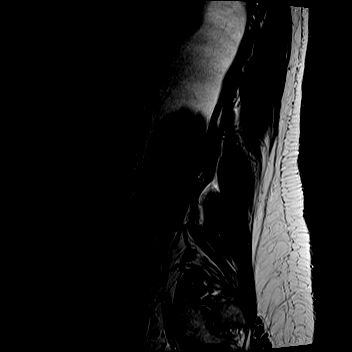
[im 7/21]
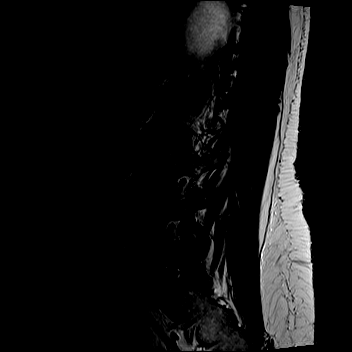
[im 11/21]
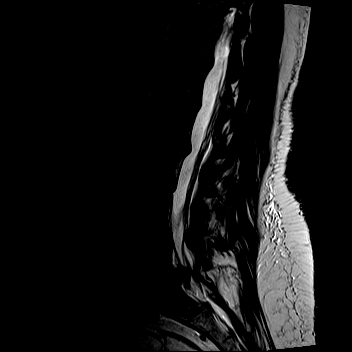
[im 14/21]
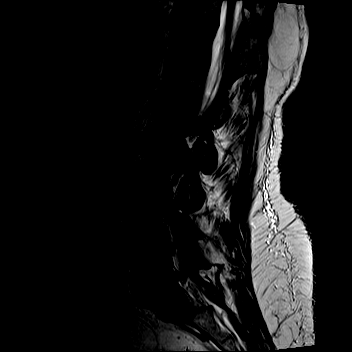
[im 17/21]
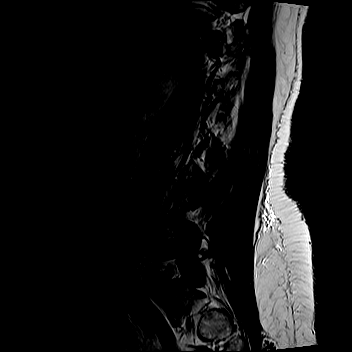
[im 21/21]
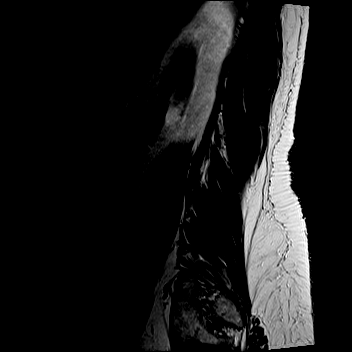

[Series 17: t1_sag · sagittal · 4.0mm · 0.81mm/px · 8 of 21 slices shown]
[im 1/21]
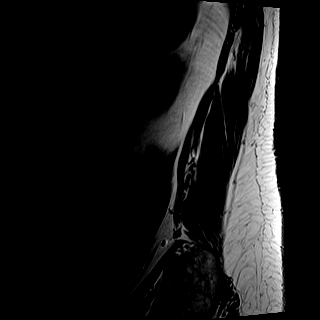
[im 3/21]
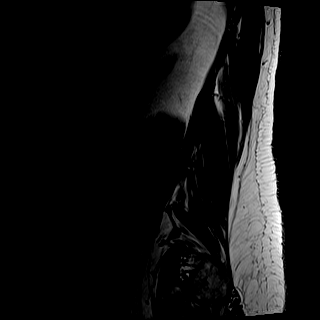
[im 6/21]
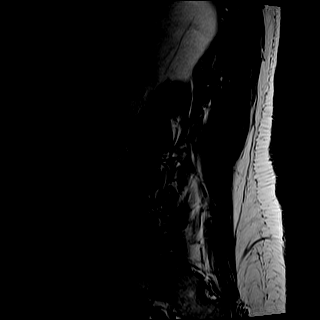
[im 9/21]
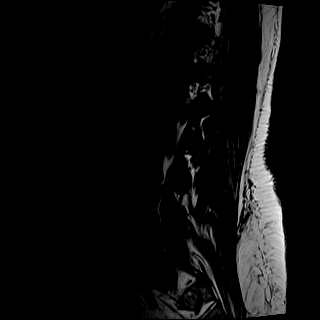
[im 12/21]
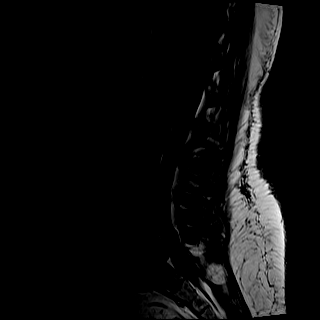
[im 15/21]
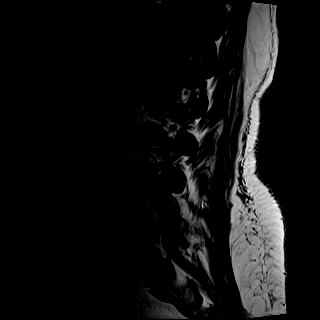
[im 18/21]
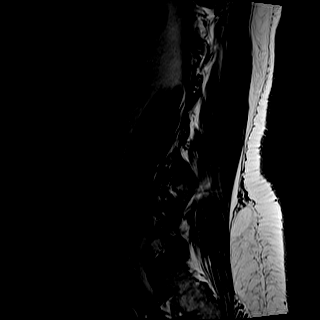
[im 21/21]
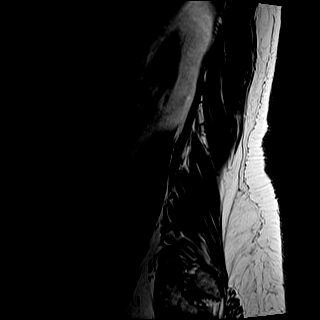

[Series 18: ir_sag · sagittal · 4.0mm · 0.45mm/px · 8 of 21 slices shown]
[im 1/21]
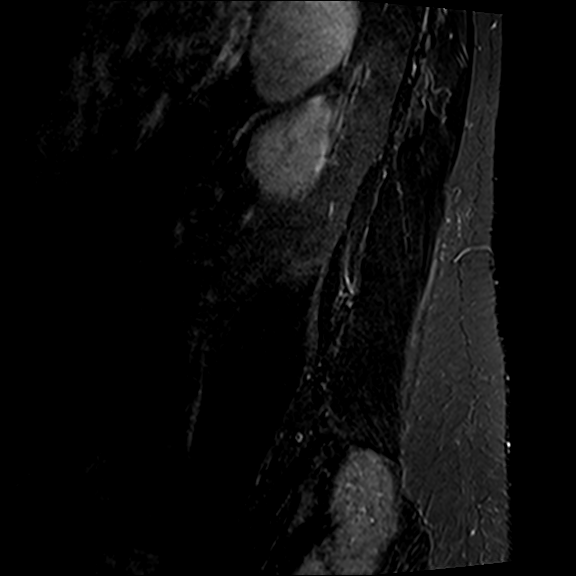
[im 3/21]
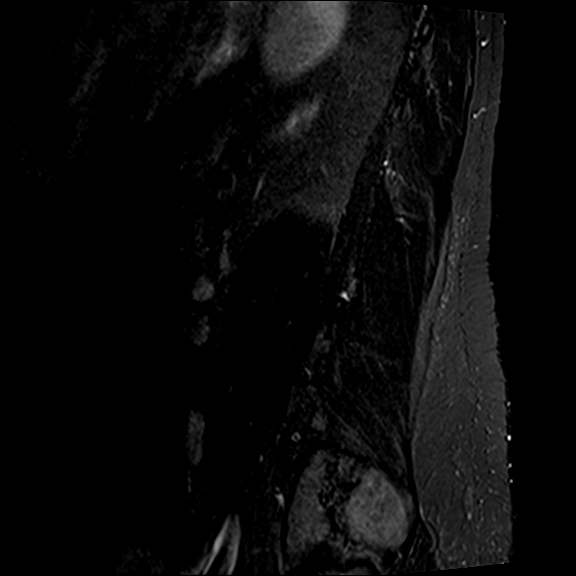
[im 6/21]
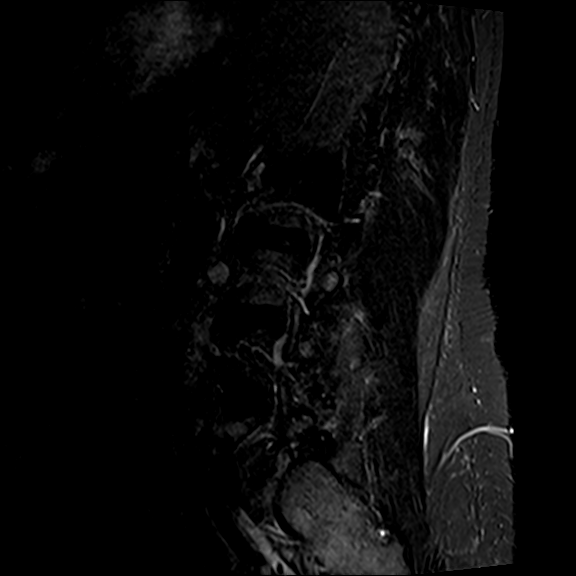
[im 9/21]
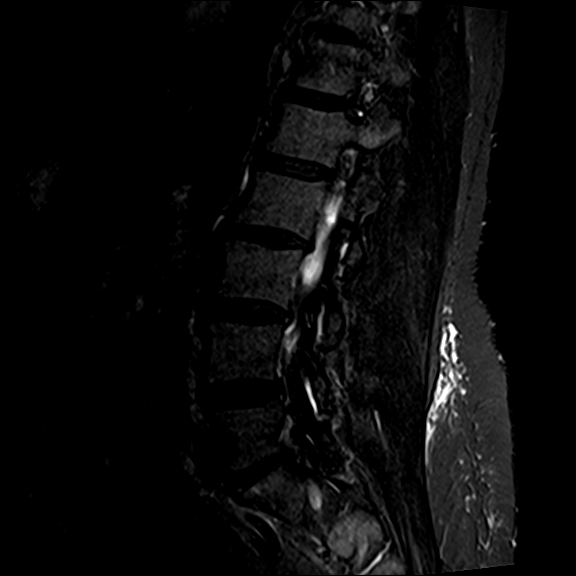
[im 12/21]
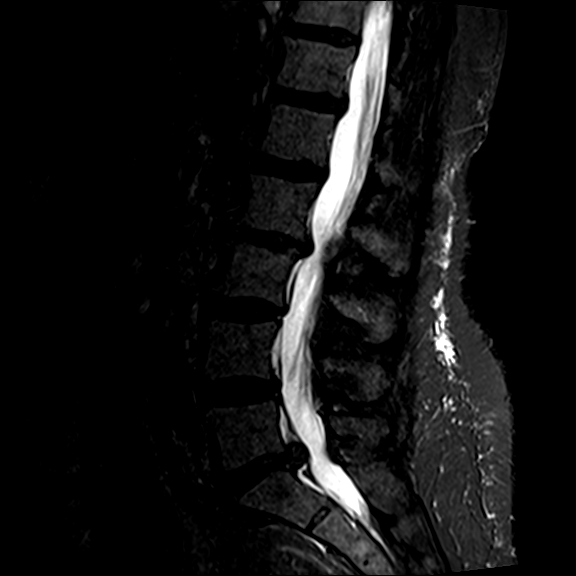
[im 15/21]
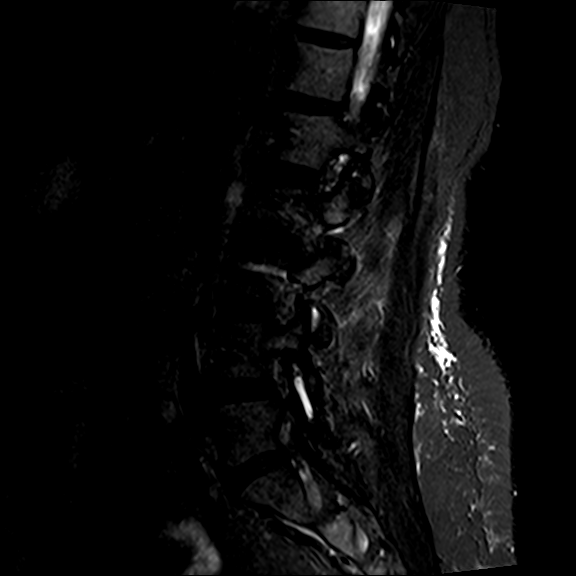
[im 18/21]
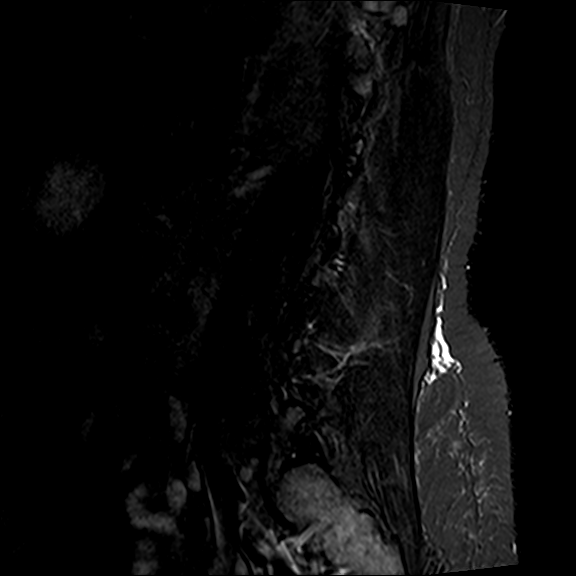
[im 21/21]
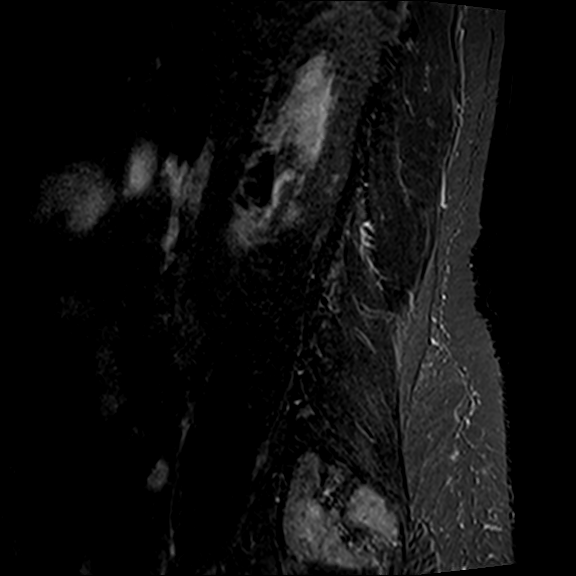

[Series 19: t2_axial · axial · 4.0mm · 0.62mm/px · z∈[-501,-333]mm · 4 of 44 slices shown]
[im 3/44]
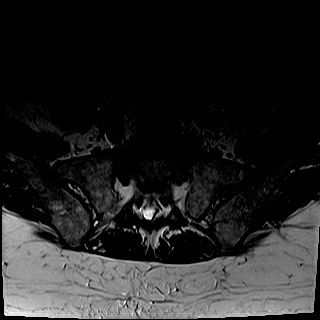
[im 6/44]
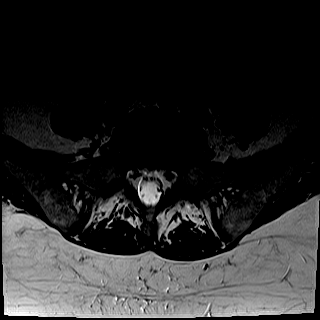
[im 23/44]
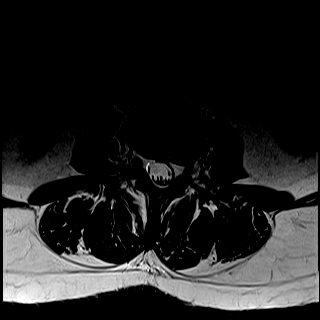
[im 38/44]
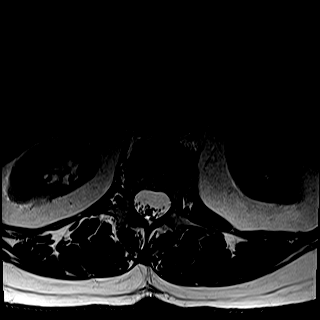

[27 of 48 positions shown; findings below may reference images not displayed]

FINDINGS: Spine labeling: Based on counting caudally using the whole spine localizer.

Alignment: Lumbar levoscoliosis with apex at L2-L3. Mild grade 1 retrolisthesis of L2 on L3, L3 on L4 and L5 on S1. Mild grade 1 anterolisthesis of L4 on L5.  

Vertebrae and discs: Multilevel degenerative disc disease and mild discogenic endplate change. 

Marrow: No suspicious osseous lesion or acute fracture.

Conus: Ends at a normal level. Unremarkable cauda equina nerve roots.

T12-L1: Mild disc bulge. No significant canal or neuroforaminal stenosis.

L1-L2: Mild disc bulge. No significant canal or neuroforaminal stenosis.

L2-L3: Mild disc bulge. Mild canal and mild right neuroforaminal stenosis.

L3-L4: Minimal disc bulge. Posterior annular fissure. Mild facet arthrosis. No significant canal or neuroforaminal stenosis.

L4-L5: Minimal disc bulge. Posterior annular fissure. Advanced facet arthrosis with facet effusions. 5 mm right facet synovial cyst abutting the descending right L5 nerve root. No significant canal stenosis. Mild right neural foraminal stenosis.

L5-S1: Mild disc bulge. Superimposed left central disc protrusion measuring 16 x 8 mm. Mild facet arthrosis. Mild canal and left greater than right lateral recess stenosis. Moderate left neural foraminal stenosis.

Mild retroperitoneal lymphadenopathy, most prominent about the iliac vessels. Representative example includes a left common iliac node measuring 15 x 8 mm. Suggested splenomegaly. Right renal cortical scarring.
IMPRESSION: Advanced L4-L5 facet arthrosis with 5 mm right facet synovial cyst abutting the descending right L5 nerve root. Correlate for right L5 radiculopathy.

Moderate left L5-S1 neural foraminal stenosis. Possible impingement of the exiting left L5 nerve root. Correlate for left L5 radiculopathy.

Large left central disc protrusion at L5-S1 resulting in left greater than right lateral recess stenosis. Possible impingement of the descending left S1 nerve root. Correlate for left S1 radiculopathy.

Mild retroperitoneal lymphadenopathy and suggested splenomegaly, progressed since 03/13/16. Consider further CT evaluation.

## 2021-07-17 IMAGING — CT CTA CHEST W/CONTRAST
2 of 5 series · 15 of 46 positions shown, 17 images · IV contrast (APPLIED)
Comparison: CT chest, 03/13/2016.

HISTORY/INDICATION:  Shortness of breath. Low oxygen saturations.
TECHNIQUE: Scans of chest are performed using PE protocol at 3 mm increments following bolus intravenous administration of 59 mL of Msovue-TUR with coronal and sagittal reconstructions and 3-D rotational reconstructions. I-STAT creatinine 1.1 mg/dL. Post-processing software generated rotating 3D MIP images, under concurrent physician supervision. Dose reduction used: Automated exposure control and adjustment of the mA and/or kV according to patient size. CT count in previous 12 months: 0.

[Series 4: angio · axial · 0.65mm/px · z∈[-92,+130]mm · 12 of 129 slices shown, 14 images]
[im 9/129  soft-tissue]
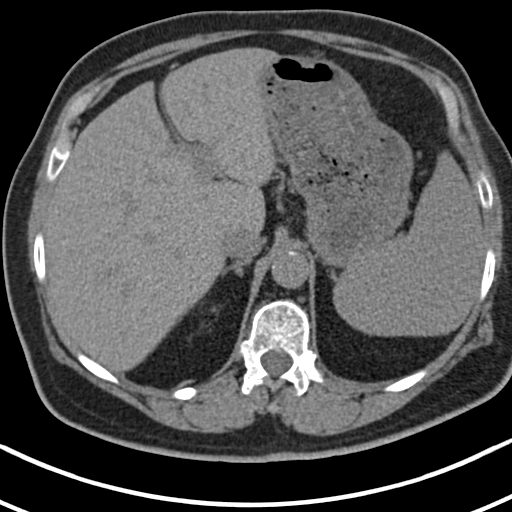
[im 9/129  bone]
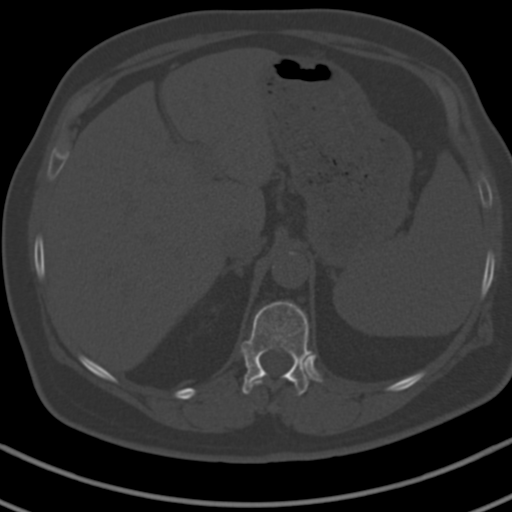
[im 18/129  soft-tissue]
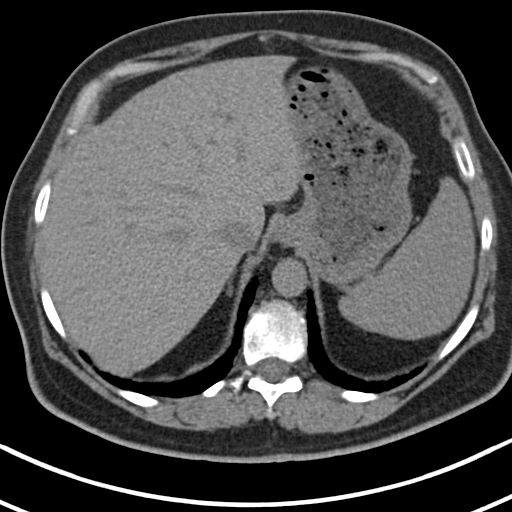
[im 26/129  soft-tissue]
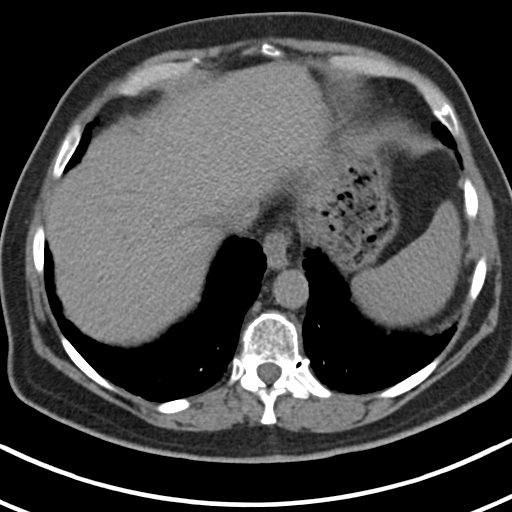
[im 43/129  soft-tissue]
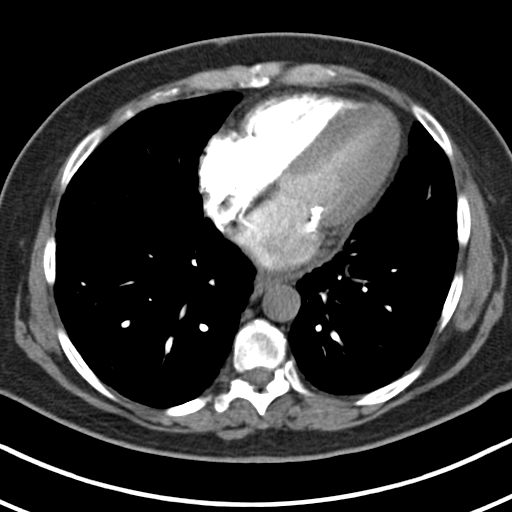
[im 52/129  soft-tissue]
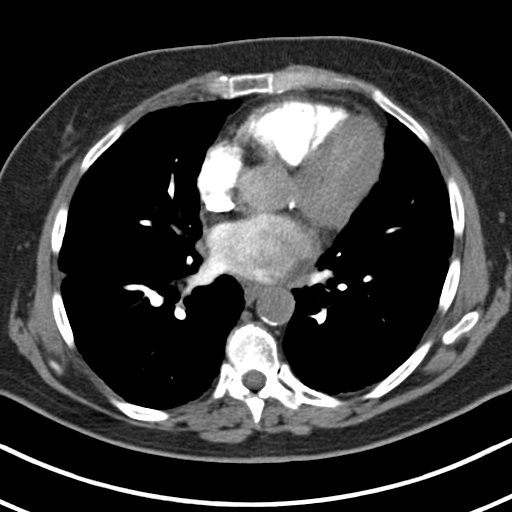
[im 60/129  soft-tissue]
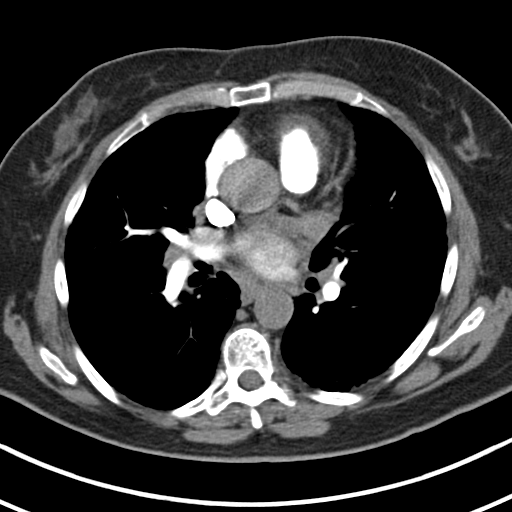
[im 69/129  soft-tissue]
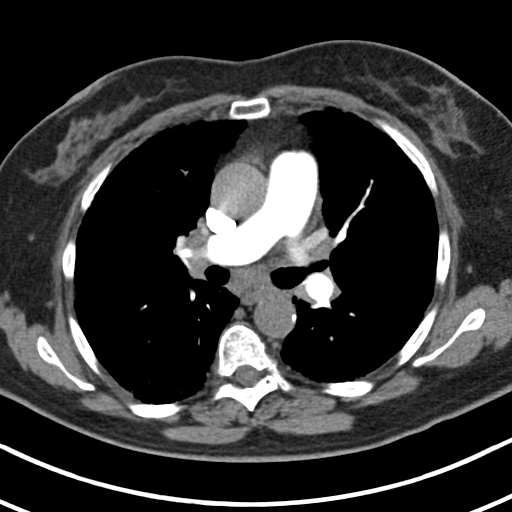
[im 77/129  soft-tissue]
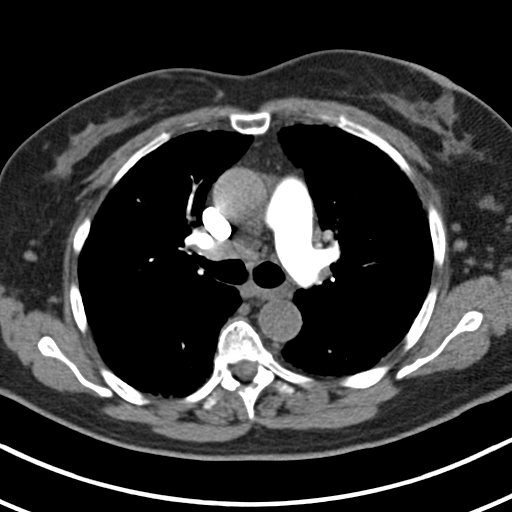
[im 86/129  soft-tissue]
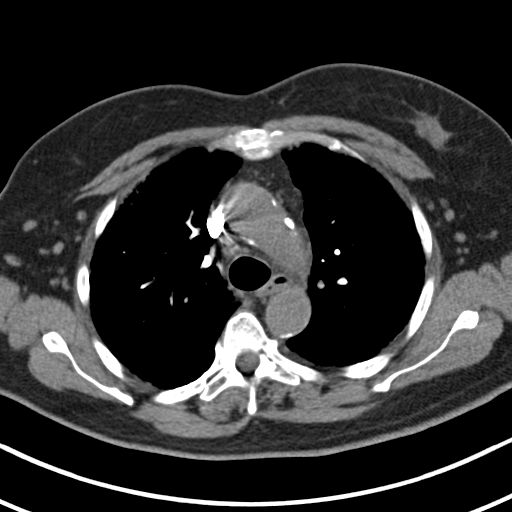
[im 86/129  bone]
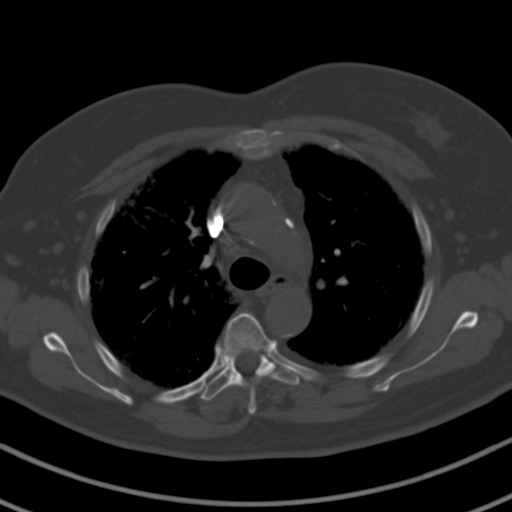
[im 103/129  soft-tissue]
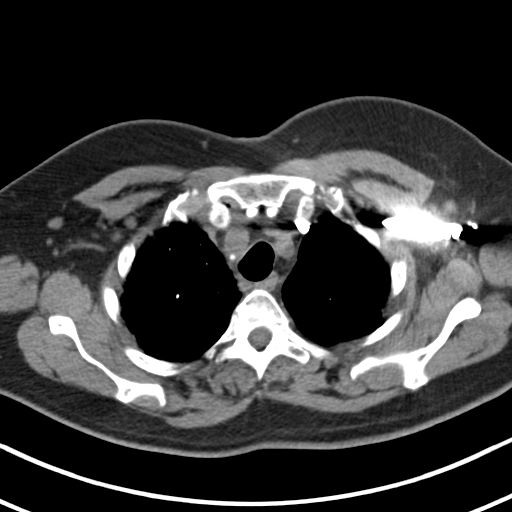
[im 111/129  soft-tissue]
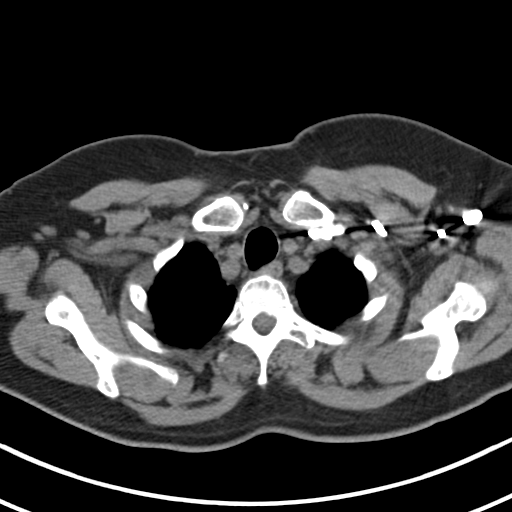
[im 120/129  soft-tissue]
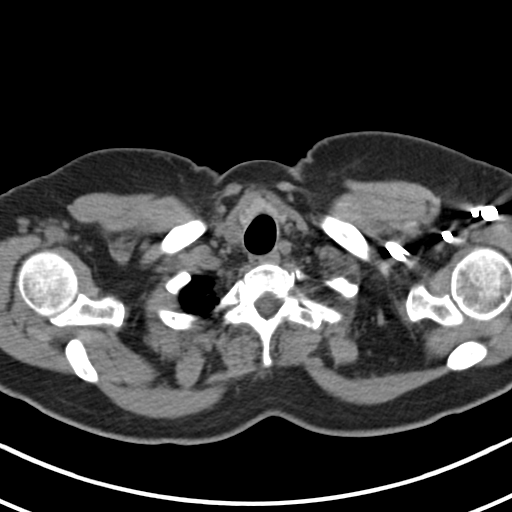

[Series 7: coronal · coronal · 0.52mm/px · 3 of 143 slices shown]
[im 41/143  soft-tissue]
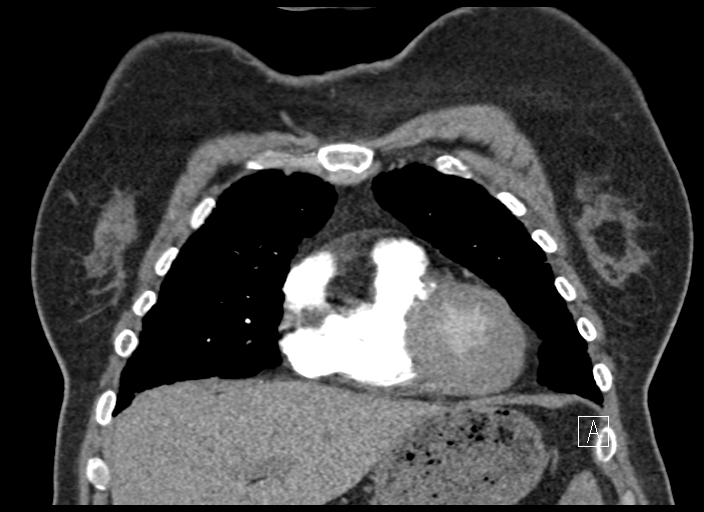
[im 61/143  soft-tissue]
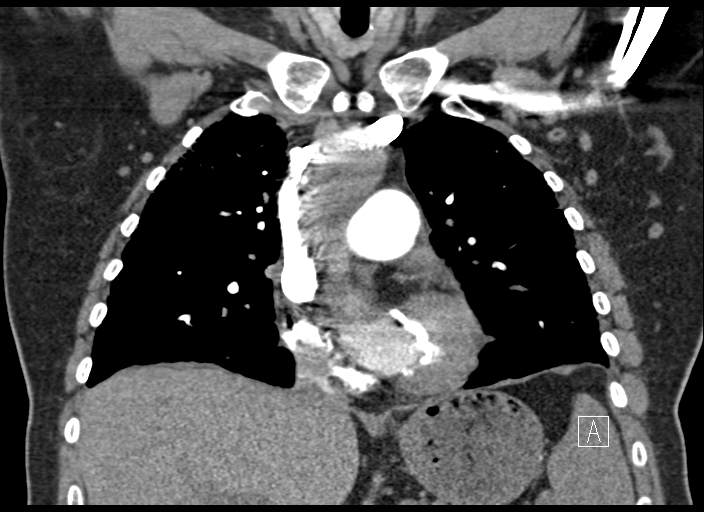
[im 82/143  soft-tissue]
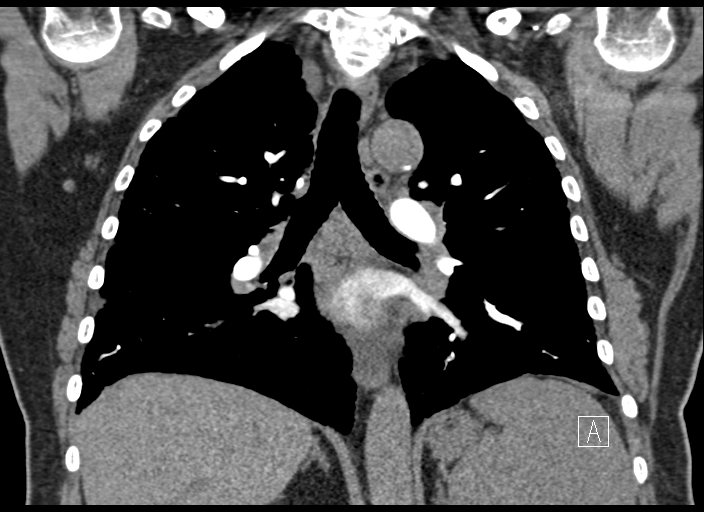

[15 of 46 positions shown; findings below may reference images not displayed]

FINDINGS: There are small shotty lymph nodes in the right and left axillary regions showing no change from previous exam. The thoracic inlet is normal. Atheromatous vascular calcifications are seen in the aorta, but not the coronary system.

Lymphadenopathy is seen in the mediastinum, with most prominent node in the subcarinal space measuring 17 mm in short axis diameter and measured 7 mm on previous exam. New bilateral hilar lymphadenopathy is present with nodes on average measuring less than 1 cm in diameter.

CT angiogram shows dense opacification of the pulmonary arteries out to the segmental pulmonary artery level. There are no findings of acute or chronic pulmonary thromboemboli. 2-D and 3-D rotational reconstructions show no additional findings. Heart size is normal. Small hiatal hernia is present.

Moderately advanced interstitial lung disease is seen with subpleural reticular fibrosis involving upper and lower lobes, and is considerably worse than on 03/13/2016 exam. Pattern of lung disease is most consistent with either idiopathic pulmonary fibrosis (UIP) or less likely organized fibrosis left as sequela of [id number] pneumonia. No nodule, mass, focal consolidation or pleural findings are seen.

2-D and 3-D reconstructions show no additional findings. Thoracic dextroscoliosis with mild degenerative disc disease is seen in spine without fracture, but no additional bone findings are present.
IMPRESSION: 1.
CT angiogram of chest is negative for acute pulmonary thromboembolism.

2.
Increasing lymphadenopathy in the subcarinal space and new bilateral hilar lymphadenopathy compared to 03/13/2016 exam.

3.
Significant worsening of interstitial lung disease compared to 03/13/16 exam; findings are most consistent with idiopathic pulmonary fibrosis (UIP).

STAT Fax

## 2022-08-02 ENCOUNTER — Encounter: Payer: PRIVATE HEALTH INSURANCE | Attending: Internal Medicine | Primary: Internal Medicine

## 2022-09-13 ENCOUNTER — Ambulatory Visit
Admit: 2022-09-13 | Discharge: 2022-09-13 | Payer: PRIVATE HEALTH INSURANCE | Attending: Otolaryngology | Primary: Internal Medicine

## 2022-09-13 DIAGNOSIS — T161XXA Foreign body in right ear, initial encounter: Secondary | ICD-10-CM

## 2022-09-13 NOTE — Progress Notes (Signed)
[facility] Office Note    Patient: Paula Mckenzie  MRN: [id number]  DOB: 1946-07-31  Gender:  female  Vital Signs: Pulse 86   Ht 5\' 2"  (1.575 m)   Wt 165 lb 9.6 oz (75.1 kg)   SpO2 97%   BMI 30.29 kg/m   Date: 09/13/2022    CC:   Chief Complaint   Patient presents with    New Patient     Patient presents today for a foreign body object within the right ear         HPI:  Paula Mckenzie is a 76 y.o. female here for evaluation of foreign body in right external auditory canal.  Notes from referring provider reviewed.  She reports that hearing aid tip got stuck in her right ear approximately 6 months ago.  She recently moved here from [place].  No other issues.    Past Medical History, Past Surgical History, Family history, Social History, and Medications were all reviewed with the patient today and updated as necessary.     No Known Allergies  There is no problem list on file for this patient.    Current Outpatient Medications   Medication Sig    amLODIPine (NORVASC) 5 MG tablet Take 1 tablet by mouth daily    vitamin D (ERGOCALCIFEROL) 1.25 MG (50000 UT) CAPS capsule Take 1 capsule by mouth once a week    FLUoxetine (PROZAC) 40 MG capsule Take 1 capsule by mouth daily    gabapentin (NEURONTIN) 100 MG capsule Take 1 capsule by mouth 3 times daily.    lisinopril-hydroCHLOROthiazide (PRINZIDE;ZESTORETIC) 20-12.5 MG per tablet Take 2 tablets by mouth daily    methocarbamol (ROBAXIN) 750 MG tablet TAKE 1 TABLET BY MOUTH 4 TIMES DAILY AS NEEDED FOR MUSCLE SPASM    omeprazole (PRILOSEC) 10 MG delayed release capsule Take 1 capsule by mouth daily    pravastatin (PRAVACHOL) 40 MG tablet Take 1 tablet by mouth nightly    rOPINIRole (REQUIP) 2 MG tablet Take 1 tablet by mouth 2 times daily     No current facility-administered medications for this visit.     History reviewed. No pertinent past medical history.  Social History     Tobacco Use    Smoking status: Unknown    Smokeless tobacco: Not on file   Substance Use Topics     Alcohol use: Not on file     History reviewed. No pertinent surgical history.  History reviewed. No pertinent family history.     ROS:    Review of Systems   Constitutional:  Negative for activity change and appetite change.   HENT:  Negative for congestion, ear discharge and ear pain.    Eyes:  Negative for redness and itching.   Respiratory:  Negative for shortness of breath.    Cardiovascular:  Negative for chest pain.   Gastrointestinal:  Negative for vomiting.   Endocrine: Negative for cold intolerance and heat intolerance.   Allergic/Immunologic: Negative for environmental allergies.   Neurological:  Negative for light-headedness.   Psychiatric/Behavioral:  Negative for sleep disturbance.           PHYSICAL EXAM:    Pulse 86   Ht 5\' 2"  (1.575 m)   Wt 165 lb 9.6 oz (75.1 kg)   SpO2 97%   BMI 30.29 kg/m     General: NAD, well-appearing  Neuro: No gross neuro deficits. CN's II-XII intact. No facial weakness.  Eyes: EOMI. Pupils reactive. No periorbital edema/ecchymosis.   Skin:  No facial erythema, rashes or concerning lesions.  Nose: No external deviations or saddling. Intranasally, septum is midline without perforations, nasal mucosa appears healthy with no erythema, mucopurulence, or polyps.  Mouth: Moist mucus membranes, normal tongue/palate mobility, no concerning mucosal lesions.   Oropharynx: clear with no erythema/exudate, no tonsillar hypertrophy.  Ears: Normal appearing auricles, no hematomas. EACs with foreign body in right external auditory canal, hearing aid tip, healthy canal skin, TM's intact with no perforations or retraction pockets. No middle ear effusions.  Neck: Soft, supple, no palpable lateral neck masses. No parotid or submandibular masses. No thyromegaly or palpable thyroid nodules. No surgical scars.  Lymphatics: No palpable cervical LAD.  Resp/Lungs: No audible stridor or wheezing, CTAB  Heart: RRR  Extremities: No clubbing or cyanosis.    Procedure: Foreign body removal from right  external auditory canal  Indications: Foreign body in right extra auditory canal  Description: Informed consent was obtained.  The operative microscope was used to examine the right ear.  A speculum was inserted.  There was noted to be a black hearing aid tip in the right ear.  This was removed using a negative forceps.    ASSESSMENT and PLAN  She can follow back up as needed.    ICD-10-CM    1. Foreign body of right ear, initial encounter  T16.1XXA REMV EXT CANAL FOREIGN BODY              Katrina Stack, MD  09/13/2022  Electronically signed    Note dictated using voice recognition software.  Please excuse any typos.

## 2023-12-06 ENCOUNTER — Inpatient Hospital Stay: Admit: 2023-12-06 | Discharge: 2023-12-06 | Disposition: A | Discharge status: Home / Self Care | Payer: MEDICARE

## 2023-12-06 DIAGNOSIS — M545 Low back pain, unspecified: Secondary | ICD-10-CM

## 2023-12-06 DIAGNOSIS — Z59819 Housing instability, housed unspecified: Secondary | ICD-10-CM

## 2023-12-06 NOTE — ED Triage Notes (Signed)
 Per EMS "Called out for low back pain. " Patient was dropped off at nursing home to visit a friend last night, was left there over night, today she was walking somewhere and started having left leg pain. She was picked up and taken to an assisted living fa

## 2023-12-06 NOTE — ED Provider Notes (Signed)
 Emergency Department Provider Note       PCP: Shea Evans, MD   Age: 77 y.o.   Sex: female     DISPOSITION    No diagnosis found.    Medical Decision Making     Patient is a 77 year old female with a past medical history of CLL and chronic back pain wi

## 2023-12-06 NOTE — Care Coordination-Inpatient (Signed)
 Patient advised NP that she is getting kicked out of her sister's apartment and is looking for housing resources. SW met with the patient, provided a directory of shelters and the [address] number. Patient encouraged to follow up with this community b

## 2024-06-01 ENCOUNTER — Ambulatory Visit: Admit: 2024-06-01 | Discharge: 2024-06-01 | Attending: Nurse Practitioner | Primary: Internal Medicine

## 2024-06-01 VITALS — BP 118/63 | HR 90 | Temp 97.00000°F | Resp 16 | Ht 60.24 in | Wt 142.4 lb

## 2024-06-01 DIAGNOSIS — R030 Elevated blood-pressure reading, without diagnosis of hypertension: Secondary | ICD-10-CM

## 2024-06-01 NOTE — Progress Notes (Signed)
 Paula Mckenzie (DOB:  1946/08/01) is a 78 y.o. female,New patient, here for evaluation of the following chief complaint(s):  Hypertension (Patient requests blood pressure check. She denies headache, nausea, weakness. She said taking medication as directed.)        SUBJECTIVE/OBJECTIVE:    HPI:  The 78 y.o female homeless patient presents in [place] at [facility] [address] for blood pressure check. Patient denies any other symptoms, otherwise, she is doing well.  She is compliances with her medications.    Continues taking medications as directed by your PCP.    Your blood pressure on arrival is 118/63. This is good.    I have done through patient's medications, history and compliance.        Hypertension  Pertinent negatives include no chest pain, headaches, neck pain, palpitations or shortness of breath.       BP 118/63 (BP Site: Left Upper Arm, Patient Position: Sitting, BP Cuff Size: Medium Adult)   Pulse 90   Temp 97 F (36.1 C) (Temporal)   Resp 16   Ht 1.53 m (5' 0.24")   Wt 64.6 kg (142 lb 6.4 oz)   SpO2 96%   BMI 27.59 kg/m     No results found for: "CBC", "CMP", "LIPIDPAN", "TSH", "HBA1C"     No Known Allergies    Current Outpatient Medications   Medication Sig Dispense Refill    albuterol sulfate HFA (PROVENTIL;VENTOLIN;PROAIR) 108 (90 Base) MCG/ACT inhaler Inhale 2 puffs into the lungs every 6 hours as needed      vitamin B-12 (CYANOCOBALAMIN) 1000 MCG tablet Take 1 tablet by mouth daily      cyanocobalamin 500 MCG tablet Take 1 tablet by mouth daily      ferrous sulfate ER (SLOW FE) 45 MG TBCR extended release tablet Take 1 tablet by mouth daily      methylPREDNISolone (MEDROL DOSEPACK) 4 MG tablet follow package directions      albuterol sulfate HFA (PROVENTIL;VENTOLIN;PROAIR) 108 (90 Base) MCG/ACT inhaler Inhale into the lungs      famotidine (PEPCID) 20 MG tablet Take 1 tablet by mouth 2 times daily      amLODIPine (NORVASC) 5 MG tablet Take 1 tablet by mouth daily       vitamin D (ERGOCALCIFEROL) 1.25 MG (50000 UT) CAPS capsule Take 1 capsule by mouth once a week      FLUoxetine (PROZAC) 40 MG capsule Take 1 capsule by mouth daily      gabapentin (NEURONTIN) 100 MG capsule Take 1 capsule by mouth 3 times daily.      lisinopril-hydroCHLOROthiazide (PRINZIDE;ZESTORETIC) 20-12.5 MG per tablet Take 2 tablets by mouth daily      methocarbamol (ROBAXIN) 750 MG tablet TAKE 1 TABLET BY MOUTH 4 TIMES DAILY AS NEEDED FOR MUSCLE SPASM      omeprazole (PRILOSEC) 10 MG delayed release capsule Take 1 capsule by mouth daily      pravastatin (PRAVACHOL) 40 MG tablet Take 1 tablet by mouth nightly      rOPINIRole (REQUIP) 2 MG tablet Take 1 tablet by mouth 2 times daily       No current facility-administered medications for this visit.       Past Medical History:   Diagnosis Date    CLL (chronic lymphocytic leukemia) (HCC)        Past Surgical History:   Procedure Laterality Date    CHOLECYSTECTOMY      HYSTERECTOMY (CERVIX STATUS UNKNOWN)  TONSILLECTOMY         Review of Systems   Constitutional:  Negative for fatigue.   HENT:  Negative for congestion.    Respiratory:  Negative for cough, chest tightness, shortness of breath and wheezing.    Cardiovascular:  Negative for chest pain, palpitations and leg swelling.   Gastrointestinal:  Negative for abdominal pain, diarrhea and nausea.   Musculoskeletal:  Negative for arthralgias, joint swelling, myalgias and neck pain.   Neurological:  Negative for dizziness and headaches.   Psychiatric/Behavioral:  Negative for agitation.        Physical Exam  Vitals reviewed.   Constitutional:       General: She is not in acute distress.     Appearance: She is not ill-appearing, toxic-appearing or diaphoretic.   HENT:      Head: Normocephalic and atraumatic.      Right Ear: Tympanic membrane, ear canal and external ear normal. There is no impacted cerumen.      Left Ear: Tympanic membrane, ear canal and external ear normal. There is no impacted cerumen.       Nose: No congestion or rhinorrhea.      Mouth/Throat:      Mouth: Mucous membranes are moist.      Pharynx: Oropharynx is clear. No oropharyngeal exudate or posterior oropharyngeal erythema.   Eyes:      General: No scleral icterus.        Right eye: No discharge.         Left eye: No discharge.      Extraocular Movements: Extraocular movements intact.      Conjunctiva/sclera: Conjunctivae normal.      Pupils: Pupils are equal, round, and reactive to light.   Neck:      Vascular: No carotid bruit.   Cardiovascular:      Rate and Rhythm: Normal rate and regular rhythm.      Pulses: Normal pulses.      Heart sounds: Normal heart sounds. No murmur heard.     No friction rub. No gallop.   Pulmonary:      Effort: Pulmonary effort is normal. No respiratory distress.      Breath sounds: Normal breath sounds. No stridor. No wheezing, rhonchi or rales.   Chest:      Chest wall: No tenderness.   Abdominal:      General: Bowel sounds are normal. There is no distension.      Palpations: Abdomen is soft. There is no mass.      Tenderness: There is no abdominal tenderness. There is no right CVA tenderness, left CVA tenderness, guarding or rebound.      Hernia: No hernia is present.   Musculoskeletal:         General: No swelling, tenderness, deformity or signs of injury. Normal range of motion.      Cervical back: Normal range of motion and neck supple. No rigidity or tenderness.      Right lower leg: No edema.      Left lower leg: No edema.   Lymphadenopathy:      Cervical: No cervical adenopathy.   Skin:     General: Skin is warm.      Capillary Refill: Capillary refill takes less than 2 seconds.      Coloration: Skin is not jaundiced or pale.      Findings: No bruising, erythema, lesion or rash.   Neurological:      Mental Status: She is alert and oriented  to person, place, and time.      Cranial Nerves: No cranial nerve deficit.      Sensory: No sensory deficit.      Motor: No weakness.      Coordination: Coordination  normal.      Gait: Gait normal.      Deep Tendon Reflexes: Reflexes normal.   Psychiatric:         Mood and Affect: Mood normal.         Behavior: Behavior normal.         Thought Content: Thought content normal.         Judgment: Judgment normal.           ASSESSMENT/PLAN:  1. Finding of above normal blood pressure  2. Unsheltered homelessness      Return if symptoms worsen or fail to improve.      On this date 06/01/2024 I have spent 30 minutes reviewing previous notes, test results and face to face with the patient discussing the diagnosis and importance of compliance with the treatment plan as well as documenting on the day of the visit.      An electronic signature was used to authenticate this note.    --Olean Berkshire, APRN - CNP

## 2024-11-22 ENCOUNTER — Emergency Department: Admit: 2024-11-22 | Payer: PRIVATE HEALTH INSURANCE | Primary: Internal Medicine

## 2024-11-22 ENCOUNTER — Inpatient Hospital Stay
Admit: 2024-11-22 | Discharge: 2024-11-22 | Disposition: A | Discharge status: Home / Self Care | Payer: PRIVATE HEALTH INSURANCE | Arrived: VH

## 2024-11-22 DIAGNOSIS — S81802A Unspecified open wound, left lower leg, initial encounter: Principal | ICD-10-CM

## 2024-11-22 MED ORDER — DOXYCYCLINE HYCLATE 100 MG PO TABS
100 | ORAL_TABLET | Freq: Two times a day (BID) | ORAL | 0 refills | 7.00000 days | Status: AC
Start: 2024-11-22 — End: 2024-11-29

## 2024-11-22 MED ORDER — LEVOFLOXACIN 750 MG PO TABS
750 | ORAL_TABLET | Freq: Every day | ORAL | 0 refills | 6.00000 days | Status: AC
Start: 2024-11-22 — End: 2024-11-29

## 2024-11-22 NOTE — ED Notes (Signed)
"  Patient mobility status ambulates with no difficulty.     I have reviewed discharge instructions with the patient.  The patient verbalized understanding.    Patient left ED via Discharge Method: ambulatory to Home with family.     Opportunity for questions and clarification provided.     Patient given 2 scripts.           Katharyn Almarie Lawrence, LPN  88/69/74 [id number]    "

## 2024-11-22 NOTE — Discharge Instructions (Signed)
"  You are seen in the emergency department today for wound on your left lower leg and some redness along with a cough after eating.    Chest x-ray is without any obvious pneumonia.  No evidence of tuberculosis.  You do have some chronic interstitial changes which can be seen in COPD or emphysema.    X-ray of your lower leg is without any bony abnormality or soft tissue gases.  This is reassuring.    I have written you for 2 antibiotics.  1 is called Levaquin  and the other is called doxycycline .  I placed some Xeroform over the wound with a nonstick bandage and an Ace wrap.  Recommend that you wash the wound at least twice per day.  Place Xeroform over the wound and keep covered      You need to contact your primary care provider tomorrow on Monday to schedule an ER follow-up this coming week.  Let them know you were seen in the ER.    You need to return to the emergency department if you have been on antibiotics for over 3 days and redness is spreading up your leg and you are developing fevers, chest pain, shortness of breath or general worsening of your condition    Thank you for your patience with us  today.    Prescriptions have been sent to [facility] on [address].  It is fine if you begin them tomorrow.          "

## 2024-11-22 NOTE — ED Provider Notes (Signed)
 "   Emergency Department Provider Note       [facility] EMERGENCY DEPT   PCP: Meade Hugh, MD   Age: 78 y.o.   Sex: female     DISPOSITION Decision To Discharge 11/22/2024 06:10:43 PM    ICD-10-CM    1. Cellulitis of left lower extremity  L03.116 levoFLOXacin  (LEVAQUIN ) 750 MG tablet     doxycycline  hyclate (VIBRA -TABS) 100 MG tablet      2. Wound of left lower extremity, initial encounter  S81.802A       3. Cough after eating  R05.8 levoFLOXacin  (LEVAQUIN ) 750 MG tablet          Medical Decision Making     Vital signs reviewed, patient stable, NAD, afebrile, nontoxic in appearance     O2 saturation 96% on room air, heart rate 86.  Afebrile  On physical exam she is neurovascularly intact.  Exam appears consistent with injury to the proximal left lower leg with some early developing cellulitis.  Lungs are clear bilaterally.  Patient's friend is concerned that patient may have pneumonia as she did have some coughing yesterday.  They were also wondering about testing for tuberculosis.  I discussed with them that we will get a chest x-ray to cover for this they are in agreement with this plan  I will also get an x-ray of the tib-fib to evaluate for any acute abnormality    I do not feel any blood work is warranted at this time    I personally cleaned the wound and placed Xeroform nonstick bandage and Ace wrap.    Chest x-ray is negative for acute cardiopulmonary abnormality.  There are chronic interstitial changes.  Possible resolving prior pneumonia.    X-ray of the left lower extremity is negative for acute bony abnormality, fracture or dislocation.  No soft tissue gases.    Based on history, physical exam, no indication for additional imaging or any blood work.  No emergent findings.  Patient is stable and can be discharged home with follow-up to primary care.  Patient states she does have a primary care provider with whom she can follow-up.    Will discharge patient on a course of doxycycline  for her lower extremity, she  does have a history of aspiration pneumonia in the past she did have a coughing fit while eating yesterday.  Will cover her with Levaquin  although lung changes appear to be chronic and interstitial.    I discussed wound care with patient and friend.  Reiterated discharge paperwork    Discussed workup, treatment, follow-up and return to ED precautions with patient.  Answered any questions that they had.  They verbalized understanding agreement.  This conversation reiterated discharge paper    Discharged home in stable condition.     I independently ordered and reviewed each unique test.    Over the counter drug management performed.  Prescription drug management performed.  Patient was discharged risks and benefits of hospitalization were considered.  Shared medical decision making was utilized in creating the patients health plan today.  I reviewed external records: provider visit note from PCP.  I reviewed external records: previous lab results from outside ED.  I reviewed external records: previous imaging study including radiologist interpretation.   Reviewed notes from primary care provider; reviewed CT chest 12/31/2023; reviewed prior lab      I interpreted the X-rays X-ray of the left lower extremity is negative for acute bony abnormality, fracture or dislocation.  No soft tissue gases;Chest x-ray  is negative for acute cardiopulmonary abnormality.  There are chronic interstitial changes.  Possible resolving prior pneumonia.  I am in agreement with radiologist interpreted..  Should be noted that I am not a radiologist therefore I do rely heavily on interpretation of image              History     78 year old female with history of hypertension, aspiration pneumonia, AKI, COPD cholecystectomy,CLL hysterectomy, tonsillectomy presents to the emergency department today accompanied by friend with chief complaint of wound to left proximal leg after fall about a week ago.  Patient states that she has been putting aloe  vera on the wound but it does not seem to be healing.  Patient states that she was eating last night and did briefly get choked.  And was coughing.  Her friend states that he has a 38-year-old and they would like to rule out any pneumonia.    The history is provided by the patient. The history is limited by a language barrier. No language interpreter was used.     Physical Exam     Vitals signs and nursing note reviewed:  Vitals:    11/22/24 1533 11/22/24 1535 11/22/24 1815   BP: 129/61  (!) 153/64   Pulse: 88  86   Resp: 16  18   Temp: 97.3 F (36.3 C)  98.4 F (36.9 C)   TempSrc: Oral  Oral   SpO2: 94%  96%   Weight:  63.5 kg (140 lb)    Height:  1.575 m (5' 2)       Physical Exam  Vitals and nursing note reviewed.   Constitutional:       General: She is not in acute distress.     Appearance: Normal appearance. She is obese. She is not ill-appearing, toxic-appearing or diaphoretic.   HENT:      Head: Atraumatic.      Nose: Nose normal.      Mouth/Throat:      Mouth: Mucous membranes are moist.      Pharynx: Oropharynx is clear.   Eyes:      Extraocular Movements: Extraocular movements intact.      Conjunctiva/sclera: Conjunctivae normal.   Cardiovascular:      Rate and Rhythm: Normal rate.      Pulses: Normal pulses.      Heart sounds: Normal heart sounds.      Comments: 2 plus PT pulse on the left side  Pulmonary:      Effort: Pulmonary effort is normal. No respiratory distress.      Breath sounds: Normal breath sounds. No stridor. No wheezing, rhonchi or rales.   Abdominal:      General: Bowel sounds are normal.      Palpations: Abdomen is soft.      Tenderness: There is no abdominal tenderness. There is no guarding or rebound.   Musculoskeletal:         General: Normal range of motion.      Cervical back: Normal range of motion.      Right lower leg: No edema.      Left lower leg: No edema.   Skin:     General: Skin is warm and dry.      Capillary Refill: Capillary refill takes less than 2 seconds.       Coloration: Skin is not jaundiced or pale.      Findings: Erythema (There is some erythema around wound anterior aspect proximal left lower leg)  present. No bruising, lesion or rash.   Neurological:      General: No focal deficit present.      Mental Status: She is alert and oriented to person, place, and time.   Psychiatric:         Mood and Affect: Mood normal.         Behavior: Behavior normal.         Thought Content: Thought content normal.         Judgment: Judgment normal.          Procedures     Procedures    Orders placed during this emergency department visit:     Orders Placed This Encounter   Procedures    XR TIBIA FIBULA LEFT (2 VIEWS)    XR CHEST (2 VW)        Medications given during this emergency department visit:   Medications - No data to display    New prescriptions:     Discharge Medication List as of 11/22/2024  6:14 PM        START taking these medications    Details   levoFLOXacin  (LEVAQUIN ) 750 MG tablet Take 1 tablet by mouth daily for 7 days, Disp-7 tablet, R-0Normal      doxycycline  hyclate (VIBRA -TABS) 100 MG tablet Take 1 tablet by mouth 2 times daily for 7 days, Disp-14 tablet, R-0Normal              Past History and Complexity:     Past Medical History:   Diagnosis Date    CLL (chronic lymphocytic leukemia) (HCC)         Past Surgical History:   Procedure Laterality Date    CHOLECYSTECTOMY      HYSTERECTOMY (CERVIX STATUS UNKNOWN)      TONSILLECTOMY          Social History     Socioeconomic History    Marital status: Single   Tobacco Use    Smoking status: Every Day     Types: Cigarettes   Vaping Use    Vaping status: Never Used   Substance and Sexual Activity    Alcohol use: Not Currently    Drug use: Not Currently     Social Drivers of Health     Financial Resource Strain: Medium Risk (02/08/2023)    Received from [facility]    Financial Resource Strain     Difficulty Paying Living Expenses: Somewhat hard     Difficulty Paying Medical Expenses: No   Food Insecurity: Food  Insecurity Present (02/08/2023)    Received from [facility]    Food Insecurity     Worried about Programme Researcher, Broadcasting/film/video in the Last Year: Often true     Ran Out of Food in the Last Year: Often true   Transportation Needs: No Transportation Needs (02/08/2023)    Received from [facility]    Transportation Needs     Lack of Transportation: No   Stress: No Stress Concern Present (02/08/2023)    Received from [facility]    Stress     Feeling of Stress : Not at all   Social Connections: Socially Isolated (02/08/2023)    Received from [facility]    Social Connections     Frequency of Communication with Friends and Family: Never     Frequency of Social Gatherings with Friends and Family: Never   Intimate Partner Violence: Not At Risk (02/08/2023)    Received from [facility]  Intimate Partner Violence     Fear of Current or Ex-Partner: No     Emotionally Abused: No     Physically Abused: No     Sexually Abused: No   Housing Stability: At Risk (02/08/2023)    Received from [facility] Stability     Was there a time when you did not have a steady place to sleep: Yes     Worried that the place you are staying is making you sick: No        Discharge Medication List as of 11/22/2024  6:14 PM        CONTINUE these medications which have NOT CHANGED    Details   !! albuterol sulfate HFA (PROVENTIL;VENTOLIN;PROAIR) 108 (90 Base) MCG/ACT inhaler Inhale 2 puffs into the lungs every 6 hours as neededHistorical Med      !! albuterol sulfate HFA (PROVENTIL;VENTOLIN;PROAIR) 108 (90 Base) MCG/ACT inhaler Inhale into the lungsHistorical Med      !! vitamin B-12 (CYANOCOBALAMIN) 1000 MCG tablet Take 1 tablet by mouth dailyHistorical Med      !! cyanocobalamin 500 MCG tablet Take 1 tablet by mouth dailyHistorical Med      famotidine (PEPCID) 20 MG tablet Take 1 tablet by mouth 2 times dailyHistorical Med      ferrous sulfate ER (SLOW FE) 45 MG TBCR extended release tablet Take 1 tablet by mouth dailyHistorical Med       methylPREDNISolone (MEDROL DOSEPACK) 4 MG tablet follow package directionsHistorical Med      amLODIPine (NORVASC) 5 MG tablet Take 1 tablet by mouth dailyHistorical Med      vitamin D (ERGOCALCIFEROL) 1.25 MG (50000 UT) CAPS capsule Take 1 capsule by mouth once a weekHistorical Med      FLUoxetine (PROZAC) 40 MG capsule Take 1 capsule by mouth dailyHistorical Med      gabapentin (NEURONTIN) 100 MG capsule Take 1 capsule by mouth 3 times daily.Historical Med      lisinopril-hydroCHLOROthiazide (PRINZIDE;ZESTORETIC) 20-12.5 MG per tablet Take 2 tablets by mouth dailyHistorical Med      methocarbamol (ROBAXIN) 750 MG tablet TAKE 1 TABLET BY MOUTH 4 TIMES DAILY AS NEEDED FOR MUSCLE SPASMHistorical Med      omeprazole (PRILOSEC) 10 MG delayed release capsule Take 1 capsule by mouth dailyHistorical Med      pravastatin (PRAVACHOL) 40 MG tablet Take 1 tablet by mouth nightlyHistorical Med      rOPINIRole (REQUIP) 2 MG tablet Take 1 tablet by mouth 2 times dailyHistorical Med       !! - Potential duplicate medications found. Please discuss with provider.           Results from this emergency department visit:      Results for orders placed or performed during the hospital encounter of 11/22/24   XR TIBIA FIBULA LEFT (2 VIEWS)    Narrative    Left Tib/fib    INDICATION:Wound to anterior aspect of left lower leg after injury      COMPARISON:  None      TECHNIQUE: AP and lateral views of the left tibia and fibula were obtained.    FINDINGS: There is no evident fracture, blastic or lytic lesion. There is no  radiographically evident osteomyelitis. No foreign body is seen. 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 Small calcaneal spur is seen.      Impression    No acute osseous abnormality. As above.    Electronically signed  by Darina Parisian   XR CHEST (2 VW)    Narrative    Chest X-ray    INDICATION: Cough, history of COPD; prior history of homelessnes.    COMPARISON: 12/31/2023    TECHNIQUE: PA and lateral views of the  chest were obtained.    FINDINGS: The trachea is midline. The heart is normal in size. Mild flattening  of diaphragms and diffuse increased interstitial markings are noted.  Interstitial changes are slightly less pronounced than previously. There is no  evident pneumothorax or pleural fluid. 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 Lung changes are less than  previously with likely interval resolved superimposed pneumonic process.  Consider follow-up with contrast-enhanced CT for most sensitive evaluation as  clinically indicated.      Electronically signed by Darina Parisian         XR TIBIA FIBULA LEFT (2 VIEWS)   Final Result   No acute osseous abnormality. As above.      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 Lung changes are less than   previously with likely interval resolved superimposed pneumonic process.   Consider follow-up with contrast-enhanced CT for most sensitive evaluation as   clinically indicated.         Electronically signed by Darina Parisian                   No results for input(s): COVID19 in the last 72 hours.     Voice dictation software was used during the making of this note.  This software is not perfect and grammatical and other typographical errors may be present.  This note has not been completely proofread for errors.       Rashaad Hallstrom E, [place]  11/22/24 1914    "

## 2024-11-22 NOTE — ED Triage Notes (Addendum)
"  Pt arriving ambulatory to triage. Pt states she fell around a week ago, hit her leg on something that she is unsure of, and now has a cut on her leg that won't heal. Pt states the cut leaks sometimes, denies odor and fevers. Pt has visible wound on the left leg with redness and swelling at time of triage. Pt also wants to be tested for pneumonia and TB  "

## 2025-01-04 ENCOUNTER — Encounter

## 2025-01-04 ENCOUNTER — Ambulatory Visit: Admit: 2025-01-04 | Discharge: 2025-01-04 | Payer: PRIVATE HEALTH INSURANCE | Attending: Nurse Practitioner

## 2025-01-04 LAB — SEDIMENTATION RATE: Sed Rate, Automated: 13 mm/h (ref 0–30)

## 2025-01-04 MED ORDER — FLUTICASONE-SALMETEROL 113-14 MCG/ACT IN AEPB
113-14 | Freq: Two times a day (BID) | RESPIRATORY_TRACT | 11 refills | Status: AC
Start: 2025-01-04 — End: ?

## 2025-01-04 NOTE — Progress Notes (Addendum)
 [facility]      [phone]      Name:  Paula Mckenzie  Date of Birth:  August 08, 1946   MRN: [id number]      Office Visit: 01/04/2025      DOWNTOWN OFFICE    The patient (or guardian, if applicable) and other individuals in attendance with the patient were advised that Artificial Intelligence will be utilized during this visit to record, process the conversation to generate a clinical note, and support improvement of the AI technology. The patient (or guardian, if applicable) and other individuals in attendance at the appointment consented to the use of AI, including the recording.         Assessment & Plan (Medical Decision Making)      Impression: 79 y.o. female     Assessment & Plan  1. 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  The CT scan from last year showed haziness in both lungs, indicative of pulmonary fibrosis. This condition involves scarring of the lungs, which can be caused by various factors such as exposure to certain chemicals or underlying conditions like rheumatoid arthritis. Lab work will be conducted today to rule out rheumatoid arthritis or other potential causes. A pulmonary function test will be scheduled to assess lung function.  - Rheumatoid Factor; Future  - ANA, Direct, w/Reflex; Future  - Sedimentation Rate; Future  - Pulse oximetry, overnight  - AMB POC PLETHYSMOGRAPHY LUNG VOLUMES W/WO AIRWAY RESIST; Future  - AMB POC SPIROMETRY W/BRONCHODILATOR; Future  - AMB POC SPIROMETRY; Future  - AMB POC DIFFUSING CAPACITY; Future    2. Chronic obstructive pulmonary disease (COPD).  There is a suspicion of underlying COPD or emphysema, which may be contributing to the cough and wheezing. The patient currently uses albuterol, a fast-acting inhaler for acute relief of symptoms. Will start AirDuo (generic) to be used as 2 puffs twice daily, to treat the underlying COPD. Efforts will be made to obtain a coupon to assist with the medication cost. Oxygen levels will be monitored during  sleep.  Patient is given verbal instructions on proper use of prescribed inhaler and is able to give adequate return demonstration.  Inhaler education was indicated due to new medication.  - AMB POC PLETHYSMOGRAPHY LUNG VOLUMES W/WO AIRWAY RESIST; Future  - AMB POC SPIROMETRY W/BRONCHODILATOR; Future  - AMB POC SPIROMETRY; Future  - AMB POC DIFFUSING CAPACITY; Future  - Fluticasone -Salmeterol (AIRDUO RESPICLICK  113/14) 113-14 MCG/ACT AEPB; Inhale 1 puff into the lungs in the morning and at bedtime  Dispense: 1 each; Refill: 11  - DEMO &/OR EVAL,PT USE,AEROSOL DEVICE  - Pulse oximetry, overnight    3. Smoking  Currently smoking 1/2 ppd.  No desire to quit currently.  Will need to readdress at subsequent visits.    4. Homelessness  This is going to be a quite challenging case due to her barriers to healthcare, but will address these as needed.        Orders Placed This Encounter   Medications    Fluticasone -Salmeterol (AIRDUO RESPICLICK  113/14) 113-14 MCG/ACT AEPB     Sig: Inhale 1 puff into the lungs in the morning and at bedtime     Dispense:  1 each     Refill:  11     No orders of the defined types were placed in this encounter.    Follow-up and Dispositions    Return in about 3 months (around 04/04/2025) for Greer/Phillips/Dejha King, COPD, ILD, CPFTs prior to appt, Arrive 15 minutes prior to appt time.  Collaborating physician is Dr. Carlin Nigh.    Paula CHRISTELLA Lofty, APRN - CNP      Total time for encounter on day of encounter was 45 minutes.  This time includes chart prep, review of tests/procedures, review of other provider's notes, documentation and counseling patient regarding disease process and medications.   _________________________________________________________________________    HISTORY OF PRESENT ILLNESS:    [name] is a 79 y.o. 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  She reports experiencing coughing episodes at night of coughing and wheezing, which have been ongoing for the past year. She also experiences occasional shortness of breath and wheezing.    Her past employment involved office work, with no exposure to chemicals such as toluene, paint thinners, metals, or occupations like sandblasting or welding. She currently resides in a homeless shelter and owns cats, having previously owned a nurse, mental health. She is not married and lives alone. She has two sons and three grandchildren residing in [place] . She relocated to [place]  from [place]  approximately 1.5 years ago at the request of her older sister, but now is estranged from her sister.    She is not currently receiving chemotherapy for her leukemia, having last received treatment in [place] . She has not undergone any chemotherapy since moving to [place] . She reports feeling fatigued but does not experience any joint pain. She uses an albuterol inhaler for acute relief of shortness of breath, coughing, or wheezing.    SOCIAL HISTORY  Marital Status: Not married  Occupations: Office work        REVIEW OF SYSTEMS: 10 point review of systems is negative except as reported in HPI.    PHYSICAL EXAM: Body mass index is 25.61 kg/m.  Vitals:    01/04/25 0838   BP: 126/72   Pulse: 85   Temp: 97 F (36.1 C)   TempSrc: Temporal   SpO2: 93%   Weight: 63.5 kg (140 lb)   Height: 1.575 m (5' 2)         General:   Alert, cooperative, no distress, appears stated age.        Eyes:   Conjunctivae/corneas clear. PERRL        Mouth/Throat:  Lips, mucosa, and tongue normal. Teeth and gums normal.        Lungs:     Breath sounds decreased bilaterally with crackles in the bases.     Heart:   Regular rate and rhythm, S1, S2 normal, no murmur,  click, rub or gallop.     Abdomen:    Soft, non-tender.     Extremities:  Extremities normal, atraumatic, no cyanosis or edema.     Skin:  Skin color normal. No rashes or lesions     Neurologic:  A&Ox3     DIAGNOSTIC TESTS:  LABS: No results found for: WBC, HGB, HCT, PLT, TSH, IGE, NTPROBNP, ANA, ANCA, RF, ESR, CRP  Imaging: I performed an independent interpretation of the patient's images.  CXR:   XR CHEST (2 VW) 11/22/2024    Narrative  Chest X-ray    INDICATION: Cough, history of COPD; prior history of homelessnes.    COMPARISON: 12/31/2023    TECHNIQUE: PA and lateral views of the chest were obtained.    FINDINGS: The trachea is midline. The heart is normal in size. Mild flattening  of diaphragms and diffuse increased interstitial markings are noted.  Interstitial changes are slightly less pronounced than previously. There is no  evident pneumothorax or pleural fluid. 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    Impression  Suspect COPD/interstitial lung disease. Lung changes are less than  previously with likely interval resolved superimposed pneumonic process.  Consider follow-up with contrast-enhanced CT for most sensitive evaluation as  clinically indicated.      Electronically signed by Darina Parisian        CT Chest:   CT CHEST WO CONTRAST 12/31/2023    Narrative  EXAM: High Resolution Chest CT (HRCT) scan: Non-contrast.    HISTORY: Other - See Order Comments;;Interstitial lung disease      COMPARISONS: CT chest 12/21/2023    TECHNIQUE: Volume acquisition of the chest was obtained from the thoracic inlet through the hemidiaphragm with high resolution technique with the patient supine (inspiratory and expiratory) and subsequently prone. Data was reconstructed at 1 mm thickness. Axial and coronal images were constructed  and reviewed.    FINDINGS:    PULMONARY PARENCHYMA and PLEURA: Redemonstrated peripheral reticular opacities with slight basilar predominance. There is associated traction bronchiectasis. 1 the overall extent of disease has not substantially changed from December 2024. Definitive areas of honeycombing. No air trapping. No pleural effusion or pneumothorax.    HILA and MEDIASTINUM: Prominent mediastinal lymph nodes. A lymph node anterior to the carina measures 12 mm in short axis, similar to previous study.    HEART, AORTA and PULMONARY VESSELS: Normal heart size. No pericardial effusion. Atherosclerosis of the thoracic aorta. Mild coronary artery calcifications. There is mild enlargement of the main pulmonary artery which could be seen in the setting of pulmonary hypertension.    BASE of NECK, SOFT TISSUES and SKELETAL: Dextroconvex thoracic spine curvature. Multilevel spondylosis. Incompletely imaged thoracic spine fixation hardware bilateral axillary lymph nodes are prominent in number but not pathologically enlarged by CT size criteria.    IMAGED UPPER ABDOMEN: Cholecystectomy.    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    Redemonstrated mediastinal lymphadenopathy.      Signed by: 01/06/2024 8:52 AM: Jacqueline MD, IVAR Stagger                  Nuclear Medicine: No results found for this or any previous visit from the past 3650 days.    PFTs:        No data to display              No results found for this or any previous visit. No results found for this or any previous visit.    FeNO: No results found for this or any previous visit.  FeNO and Likelihood of Eosinophilic Asthma   Unlikely Intermediate Likely   <25 ppb 25-50 ppb >50ppb     Exercise Oximetry:  O2 sat on room air at rest is 93% and resting heart rate is 84 bpm.  After walking for 4 minutes, lowest O2 sat was 92% and maximum heart rate was 88 bpm.    Echo: No results found for this or any  previous visit from the past 3650 days.    PMH Reference Info:                                                                                                                Immunization History   Administered Date(s) Administered    COVID-19, SPIKEVAX (Moderna), (age 12y+), IM, 50mcg/0.5mL 12/20/2022    COVID-19, mNEXSPIKE Laren), (age 12y+), IM, 53mcg/0.2mL 09/12/2024     Past Medical History:   Diagnosis Date    CLL (chronic lymphocytic leukemia) (HCC)         Tobacco Use      Smoking status: Every Day        Packs/day: 0.50        Years: 0.5 packs/day for 60.0 years (30.0 ttl pk-yrs)        Types: Cigarettes, Cigars        Start date: 1966      Smokeless tobacco: Not on file    No Known Allergies  Current Outpatient Medications   Medication Instructions    albuterol sulfate HFA (PROVENTIL;VENTOLIN;PROAIR) 108 (90 Base) MCG/ACT inhaler Inhale into the lungs    amLODIPine (NORVASC) 5 mg, DAILY    cyanocobalamin 500 mcg, DAILY    famotidine (PEPCID) 20 mg, 2 TIMES DAILY    ferrous sulfate ER (SLOW FE) 45 MG TBCR extended release tablet 1 tablet, DAILY    FLUoxetine (PROZAC) 40 mg, DAILY    Fluticasone -Salmeterol (AIRDUO RESPICLICK  113/14) 113-14 MCG/ACT AEPB 1 puff, Inhalation, 2 times daily    gabapentin (NEURONTIN) 100 mg, 3 TIMES DAILY    lisinopril-hydroCHLOROthiazide (PRINZIDE;ZESTORETIC) 20-12.5 MG per tablet 2 tablets, DAILY    methocarbamol (ROBAXIN) 750 MG tablet TAKE 1 TABLET BY MOUTH 4 TIMES DAILY AS NEEDED FOR MUSCLE SPASM    methylPREDNISolone (MEDROL DOSEPACK) 4 MG tablet follow package directions    omeprazole (PRILOSEC) 10 mg, DAILY    pravastatin (PRAVACHOL) 40 mg, NIGHTLY    rOPINIRole (REQUIP) 2 mg, 2 TIMES DAILY    vitamin B-12 (CYANOCOBALAMIN) 1,000 mcg, DAILY    vitamin D (ERGOCALCIFEROL) 50,000 Units, WEEKLY

## 2025-01-05 ENCOUNTER — Encounter

## 2025-01-05 LAB — RHEUMATOID FACTOR: Rheumatoid Factor: NEGATIVE

## 2025-01-05 LAB — ANA REFLEX PANEL
Anti-RNP: <0.2 AI (ref 0.0–0.9)
CENTROMERE PROTEIN B ANTIBODY: >8 AI — ABNORMAL HIGH (ref 0.0–0.9)
Chromatin Antibody: <0.2 AI (ref 0.0–0.9)
ENA JO-1 Ab: <0.2 AI (ref 0.0–0.9)
ENA SCL-70 Ab: <0.2 AI (ref 0.0–0.9)
ENA SSA (RO) Ab: <0.2 AI (ref 0.0–0.9)
ENA SSB (LA) Ab: <0.2 AI (ref 0.0–0.9)
ENA Smith (SM) Ab: <0.2 AI (ref 0.0–0.9)
dsDNA Ab: <1 [IU]/mL (ref 0–9)

## 2025-01-05 LAB — ANA, DIRECT, W/REFLEX: ANA: POSITIVE — AB

## 2025-01-05 NOTE — Result Encounter Note (Signed)
 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  This is likely the cause of the scarring process on her lungs.  She needs an echo and a referral to rheumatology.

## 2025-01-06 NOTE — Telephone Encounter (Signed)
 I left message for patient to return my call for results. Paula Mckenzie

## 2025-01-06 NOTE — Telephone Encounter (Signed)
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  This is likely the cause of the scarring process on her lungs.  She needs an echo and a referral to rheumatology.  ----- Message -----  From: Edi, [facility] Incoming [facility]  Sent: 01/04/2025  12:52 PM EST  To: Dorthea CHRISTELLA Lofty, APRN - CNP

## 2025-01-11 NOTE — Telephone Encounter (Signed)
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  This is likely the cause of the scarring process on her lungs.  She needs an echo and a referral to rheumatology.  ----- Message -----  From: Edi, [facility] Incoming [facility]  Sent: 01/04/2025  12:52 PM EST  To: Dorthea CHRISTELLA Lofty, APRN - CNP

## 2025-01-11 NOTE — Telephone Encounter (Signed)
 I left a message with son to have patient to call me for results.Paula Mckenzie
